# Patient Record
Sex: Female | Born: 1980 | Race: White | Hispanic: No | Marital: Married | Smoking: Never smoker
Health system: Southern US, Community
[De-identification: ages and names within clinical notes are randomized; demographics above are authoritative.]

## PROBLEM LIST (undated history)

## (undated) DIAGNOSIS — E119 Type 2 diabetes mellitus without complications: Secondary | ICD-10-CM

## (undated) DIAGNOSIS — Z789 Other specified health status: Secondary | ICD-10-CM

## (undated) HISTORY — PX: NO PAST SURGERIES: SHX2092

## (undated) HISTORY — DX: Other specified health status: Z78.9

---

## 2005-07-31 ENCOUNTER — Ambulatory Visit: Payer: Self-pay

## 2005-10-29 ENCOUNTER — Inpatient Hospital Stay: Payer: Self-pay | Admitting: Unknown Physician Specialty

## 2005-12-20 ENCOUNTER — Other Ambulatory Visit: Payer: Self-pay

## 2005-12-21 ENCOUNTER — Inpatient Hospital Stay: Payer: Self-pay | Admitting: Internal Medicine

## 2006-01-23 ENCOUNTER — Ambulatory Visit: Payer: Self-pay | Admitting: Specialist

## 2006-04-27 ENCOUNTER — Ambulatory Visit: Payer: Self-pay | Admitting: Specialist

## 2007-04-17 IMAGING — CR DG CHEST 2V
1 series · 3 of 3 positions shown · non-contrast
Comparison: none

REASON FOR EXAM: PE, had VQ scan
COMMENTS:

[Series 4295: postero_anterior · 0.22mm/px · 3 of 3 slices shown]
[im 1/3]
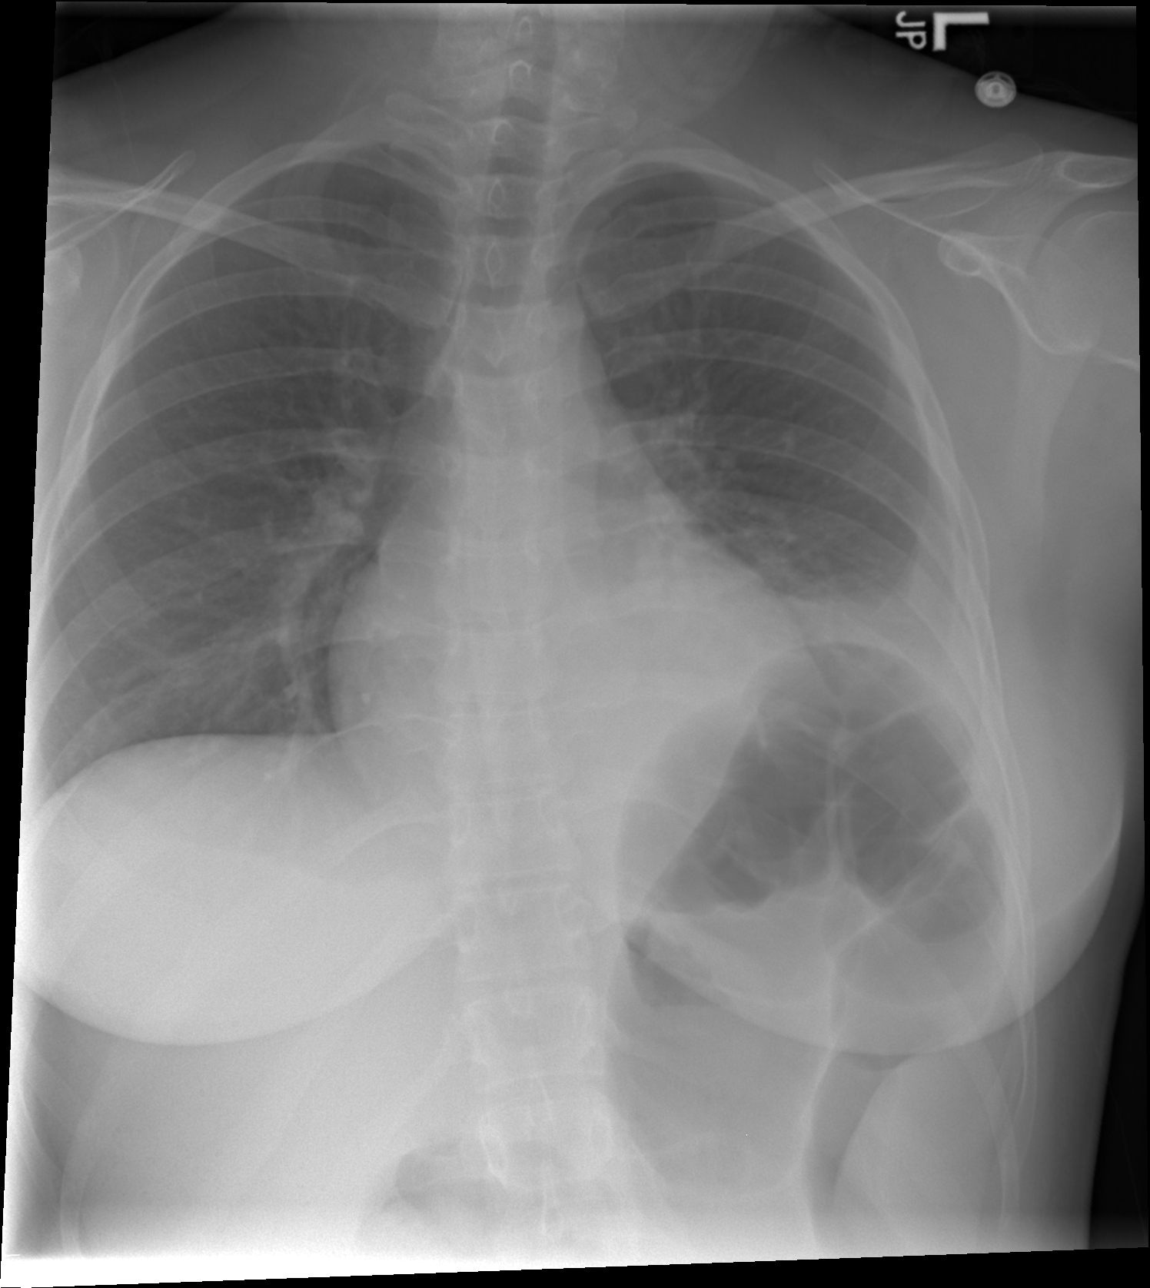
[im 2/3]
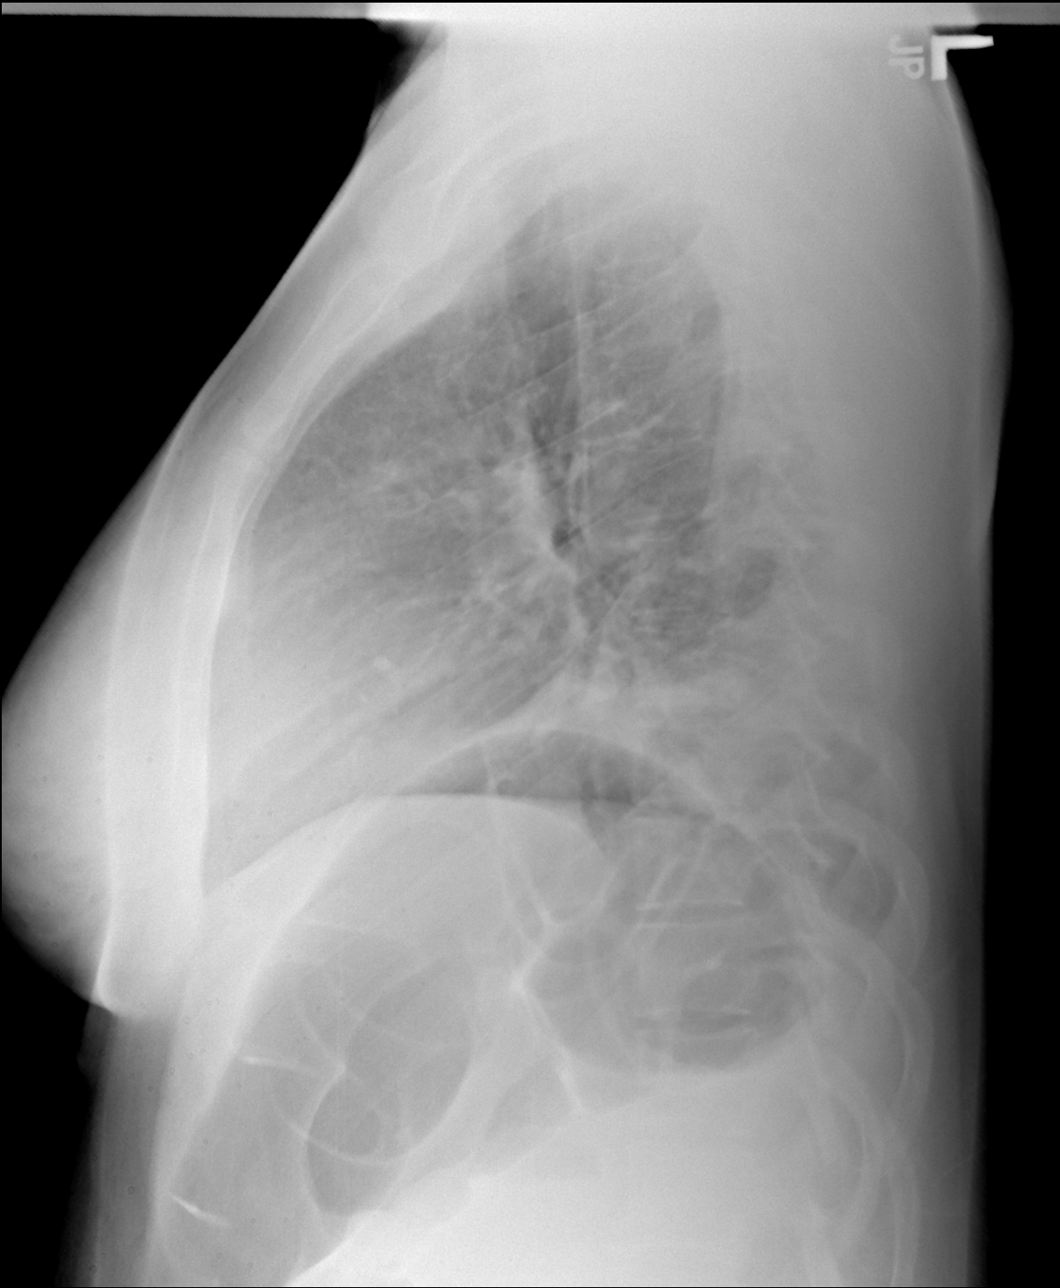
[im 3/3]
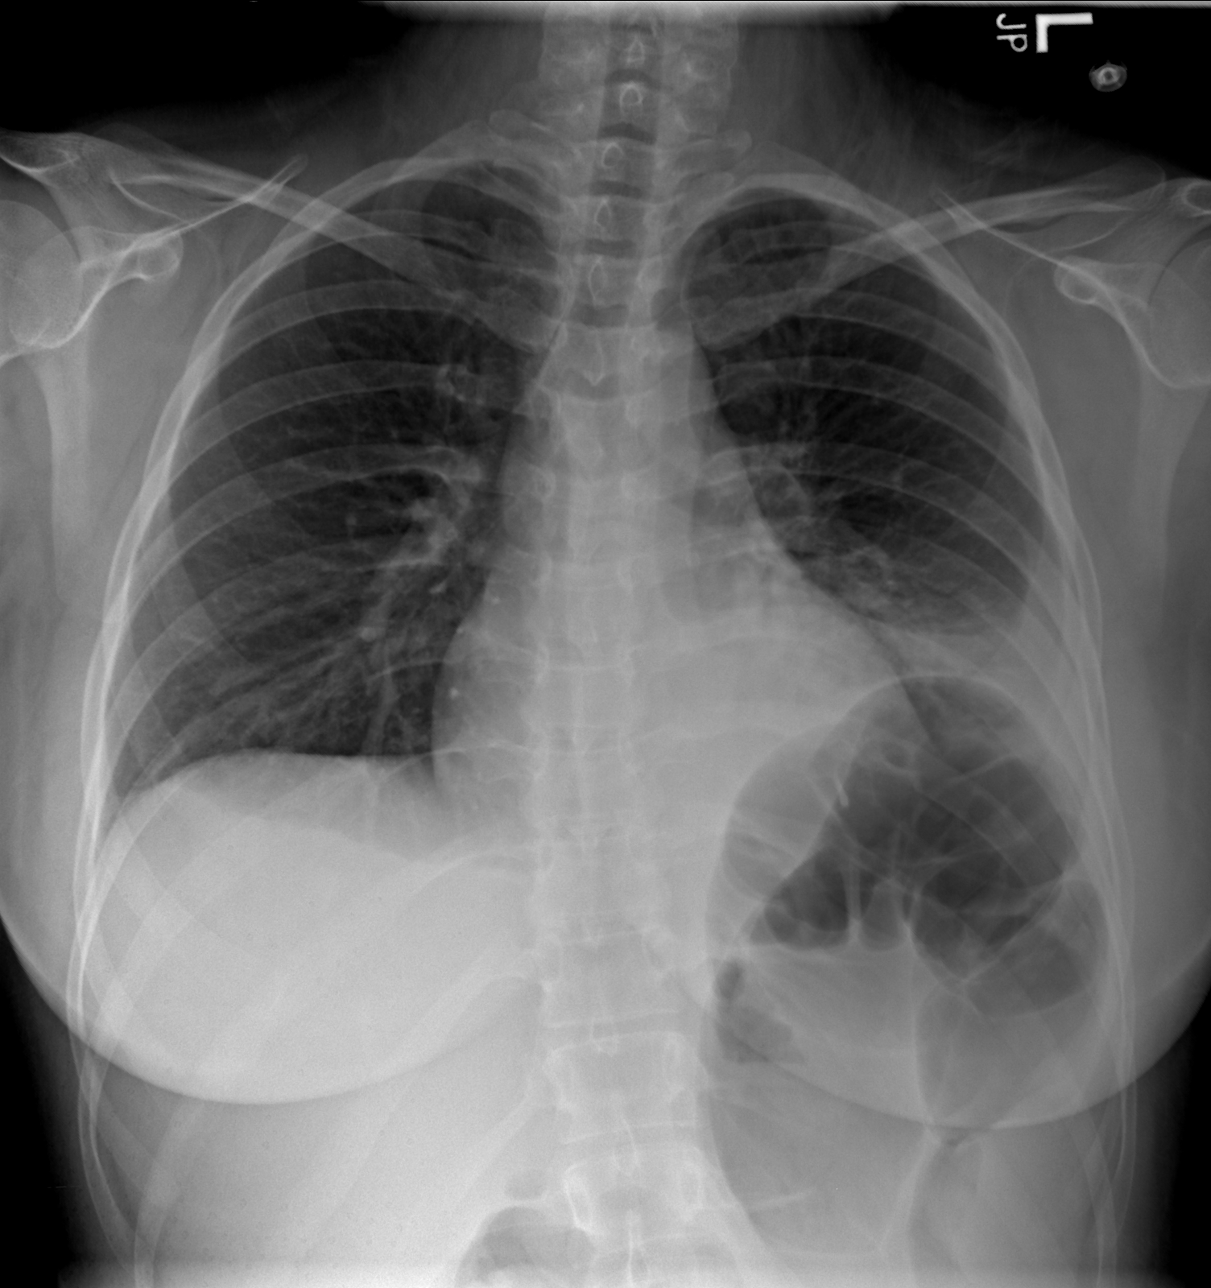

[3 of 3 positions shown; findings below may reference images not displayed]

PROCEDURE:     DXR - DXR CHEST PA (OR AP) AND LATERAL  - December 22, 2005 [DATE]

RESULT:          Comparison is made to a study [DATE].

There has developed a pleural effusion with further atelectasis at the LEFT
lung base posteriorly.  The LEFT hemidiaphragm is slightly elevated with
gaseous distention of a bowel loop deep to this.  The RIGHT lung is clear.
The heart is top normal in size.
IMPRESSION: There is LEFT basilar atelectasis and/or developing
infiltrate.  There is likely Kinley Samir Jose amount of pleural fluid present as
well.

## 2007-08-21 IMAGING — CT CT CHEST W/ CM
1 series · 15 of 31 positions shown, 19 images · non-contrast
Comparison: none

REASON FOR EXAM: Mediastinal adenopathy
COMMENTS:

[Series 2: soft tissue · axial · 0.65mm/px · z∈[-582,-332]mm · 15 of 56 slices shown, 19 images]
[im 3/56  mediastinal]
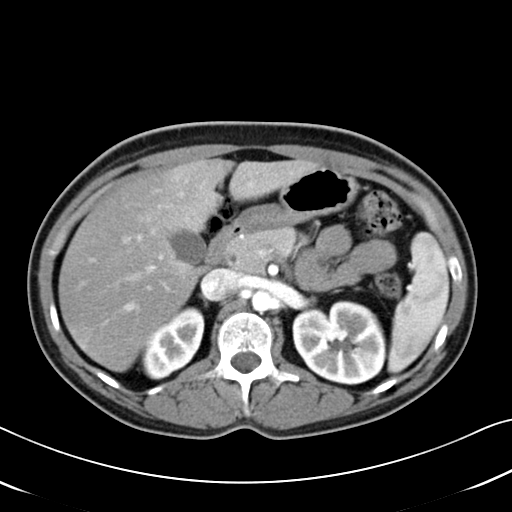
[im 3/56  lung]
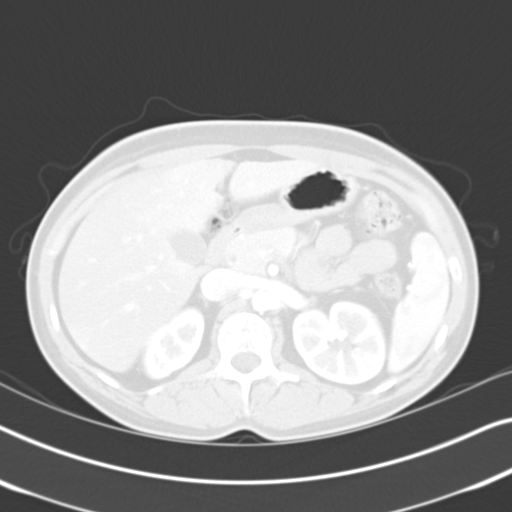
[im 7/56  lung]
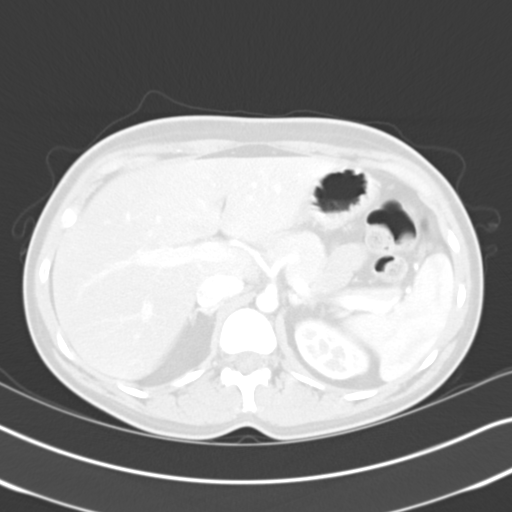
[im 11/56  lung]
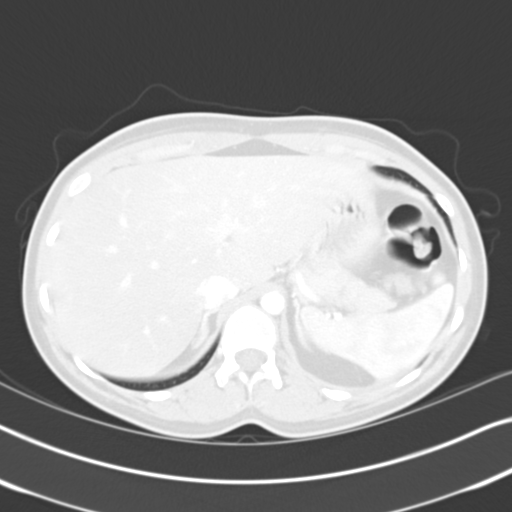
[im 13/56  lung]
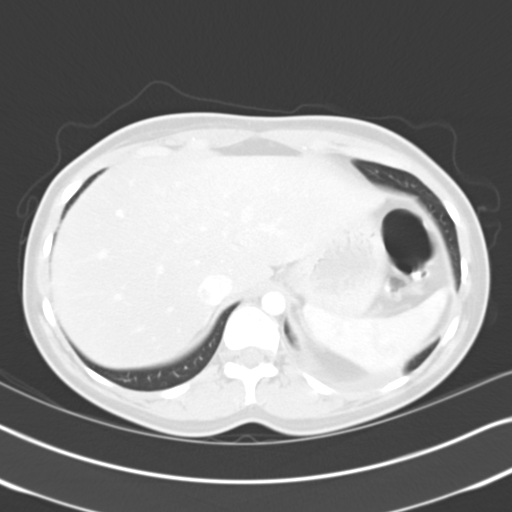
[im 17/56  mediastinal]
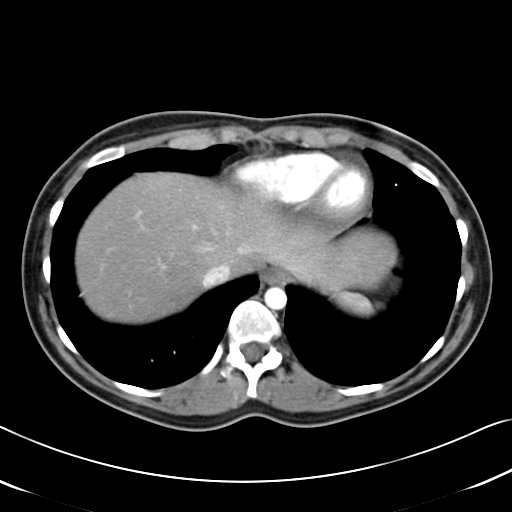
[im 17/56  lung]
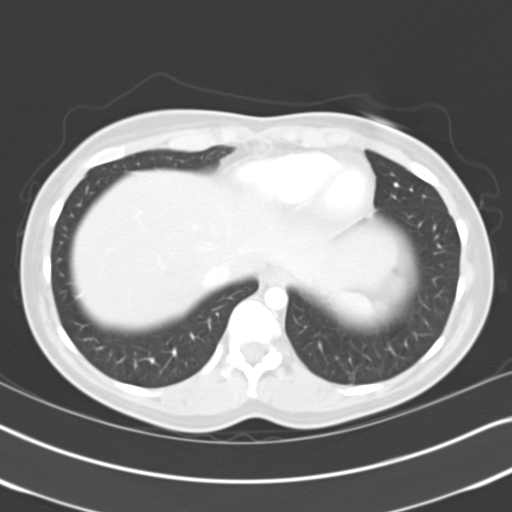
[im 21/56  lung]
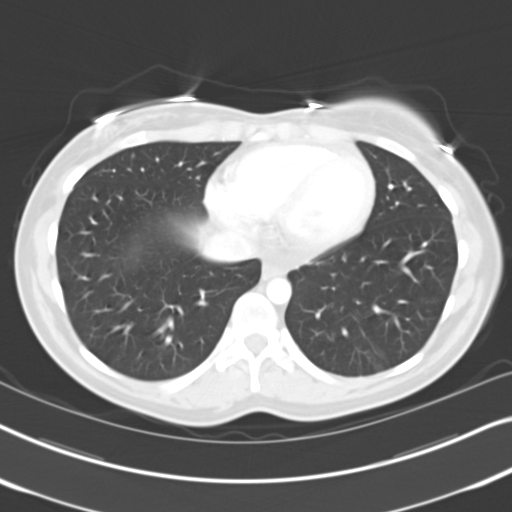
[im 25/56  lung]
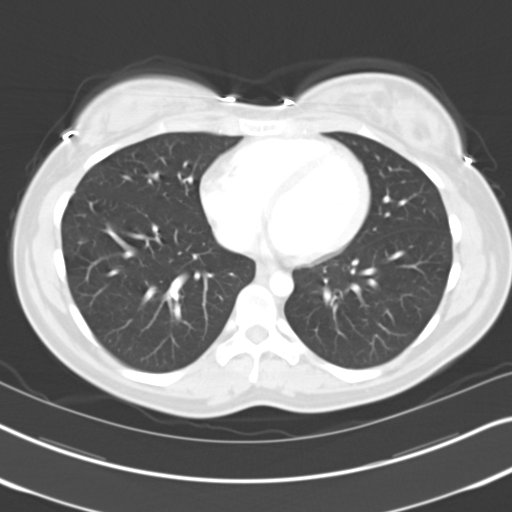
[im 29/56  lung]
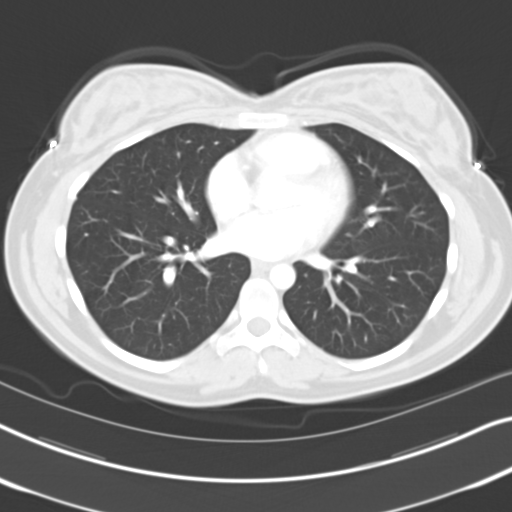
[im 31/56  mediastinal]
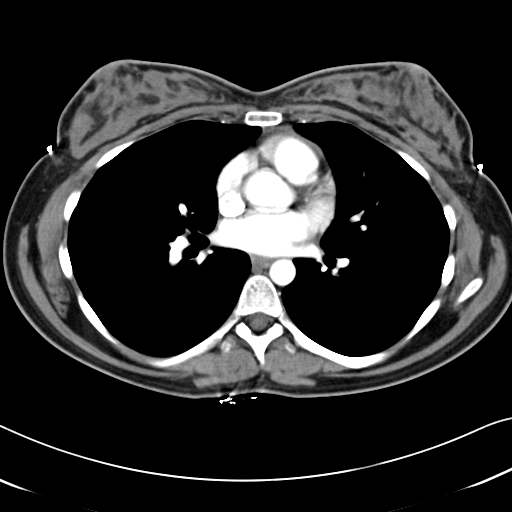
[im 31/56  lung]
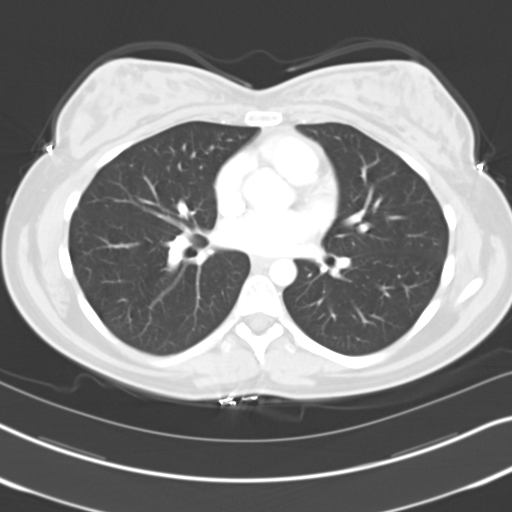
[im 34/56  lung]
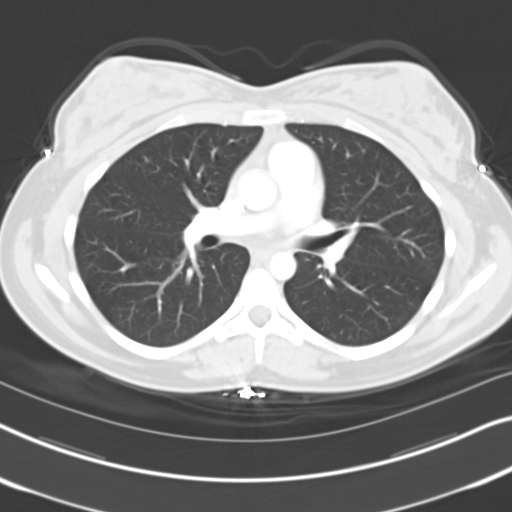
[im 37/56  lung]
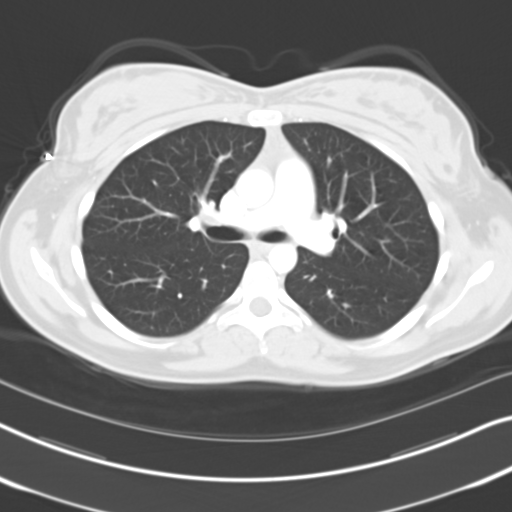
[im 41/56  lung]
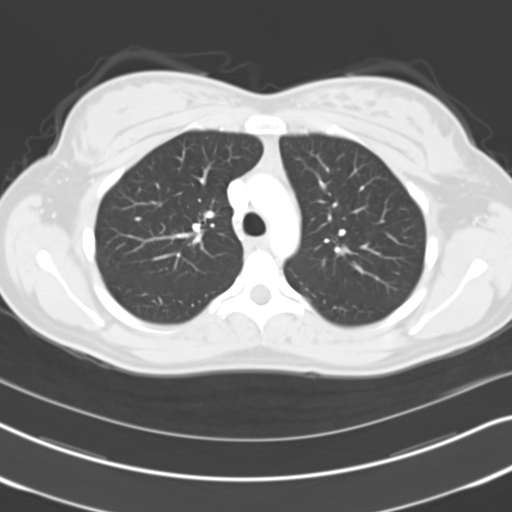
[im 45/56  mediastinal]
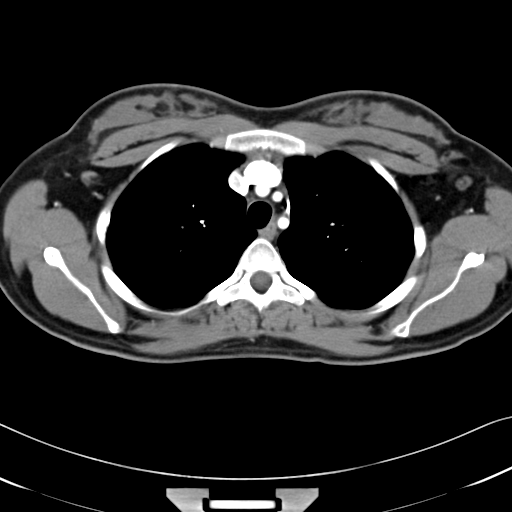
[im 45/56  lung]
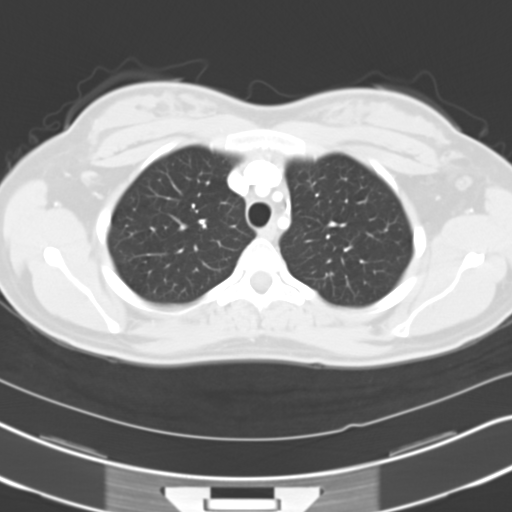
[im 49/56  lung]
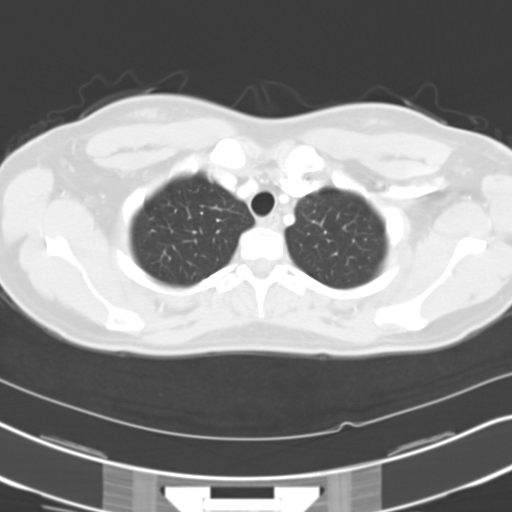
[im 53/56  lung]
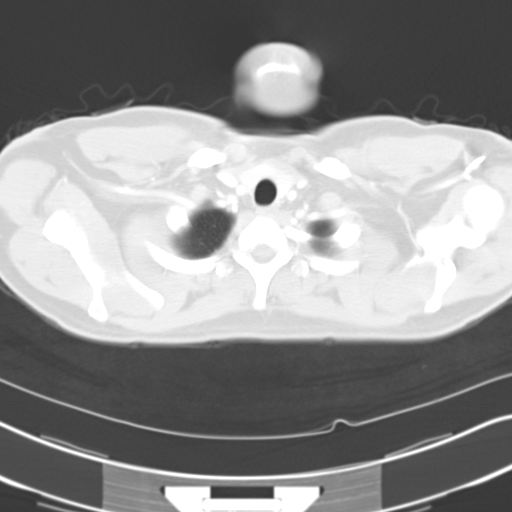

[15 of 31 positions shown; findings below may reference images not displayed]

PROCEDURE:     CT  - CT CHEST WITH CONTRAST  - April 27, 2006 [DATE]

RESULT:     Comparison is made to the prior study dated 12/20/05.

Spiral 5-mm sections were obtained from the thoracic inlet through the lung
bases status post intravenous administration of 75 milliliters of
[id number].

Evaluation of the mediastinum and hilar regions and structures demonstrates
no evidence of mediastinal or hilar adenopathy or masses.  The previously
described LEFT hilar mass appears to have resolved in the interim.
Infiltrate described within the LEFT lung base has also decreased in
conspicuity and size compared to the previous study.  Small LEFT pleural
effusion appears to have resolved.  There does not appear to be evidence of
pulmonary nodules or masses.  Minimal area of increased density is
appreciated within the RIGHT lung base which appears to have resolved in the
interim.  Evaluation of the upper abdominal viscera once again demonstrates
a heterogeneous pattern within the liver.  No further gross abnormalities
are identified.
IMPRESSION: Interval resolution of the previously described LEFT hilar mass and
adenopathy.

No evidence of new pulmonary masses or nodules.

Residual mild increased density is demonstrated within the lung bases.

Note not mentioned above, there is enlargement of axillary nodes which
demonstrates fatty hilum though measure approximately 7-8 mm in diameter.
No further masses or adenopathy is appreciated.

Heterogeneous enhancement pattern of the liver.  This may represent element
of hepatic steatosis.

## 2007-11-30 ENCOUNTER — Emergency Department: Payer: Self-pay | Admitting: Internal Medicine

## 2007-11-30 ENCOUNTER — Other Ambulatory Visit: Payer: Self-pay

## 2017-10-29 ENCOUNTER — Ambulatory Visit (INDEPENDENT_AMBULATORY_CARE_PROVIDER_SITE_OTHER): Payer: BLUE CROSS/BLUE SHIELD | Admitting: Family Medicine

## 2017-10-29 ENCOUNTER — Encounter: Payer: Self-pay | Admitting: Family Medicine

## 2017-10-29 VITALS — BP 140/100 | HR 88 | Temp 97.5°F | Resp 15 | Wt 122.6 lb

## 2017-10-29 DIAGNOSIS — J069 Acute upper respiratory infection, unspecified: Secondary | ICD-10-CM | POA: Diagnosis not present

## 2017-10-29 NOTE — Progress Notes (Signed)
Subjective:     Patient ID: Allison Browning, female   DOB: Oct 25, 1980, 37 y.o.   MRN: [id number] Chief Complaint  Patient presents with  . Sinus Problem    Patient comes in office with concerns of sinus pain and pressure for the past 2 days. Patient reports sinus pressure above eyes, headache, stuffy nose, sinus drainage and cough. Patient has tried otc Cold& Flu   HPI Last took decongestants early this AM  Review of Systems     Objective:   Physical Exam  Constitutional: She appears well-developed and well-nourished. No distress.  Ears: T.M's intact without inflammation Sinuses: mild maxillary sinus tenderness Throat: no tonsillar enlargement or exudate Neck: no cervical adenopathy Lungs: clear     Assessment:    1. Viral upper respiratory tract infection    Plan:    Discussed use of Mucinex D for congestion, Delsym for cough, and Benadryl for postnasal drainage

## 2017-10-29 NOTE — Patient Instructions (Signed)
Discussed use of Mucinex D for congestion, Delsym for cough, and Benadryl for postnasal drainage 

## 2019-10-10 ENCOUNTER — Other Ambulatory Visit: Payer: Self-pay

## 2019-10-11 ENCOUNTER — Ambulatory Visit: Payer: BC Managed Care – PPO | Attending: Internal Medicine

## 2019-10-11 DIAGNOSIS — Z20822 Contact with and (suspected) exposure to covid-19: Secondary | ICD-10-CM

## 2019-10-12 LAB — NOVEL CORONAVIRUS, NAA: SARS-CoV-2, NAA: NOT DETECTED

## 2019-10-18 ENCOUNTER — Ambulatory Visit: Payer: BC Managed Care – PPO | Attending: Internal Medicine

## 2019-10-18 DIAGNOSIS — Z20822 Contact with and (suspected) exposure to covid-19: Secondary | ICD-10-CM

## 2019-10-20 LAB — NOVEL CORONAVIRUS, NAA: SARS-CoV-2, NAA: NOT DETECTED

## 2019-10-26 ENCOUNTER — Ambulatory Visit: Payer: BC Managed Care – PPO | Attending: Internal Medicine

## 2019-10-26 DIAGNOSIS — Z20822 Contact with and (suspected) exposure to covid-19: Secondary | ICD-10-CM

## 2019-10-27 LAB — NOVEL CORONAVIRUS, NAA: SARS-CoV-2, NAA: NOT DETECTED

## 2019-11-01 ENCOUNTER — Ambulatory Visit: Payer: BC Managed Care – PPO | Attending: Internal Medicine

## 2019-11-01 DIAGNOSIS — Z20822 Contact with and (suspected) exposure to covid-19: Secondary | ICD-10-CM

## 2019-11-02 LAB — NOVEL CORONAVIRUS, NAA: SARS-CoV-2, NAA: NOT DETECTED

## 2019-11-07 ENCOUNTER — Ambulatory Visit: Payer: BC Managed Care – PPO | Attending: Internal Medicine

## 2019-11-07 ENCOUNTER — Other Ambulatory Visit: Payer: BC Managed Care – PPO

## 2019-11-07 DIAGNOSIS — Z20822 Contact with and (suspected) exposure to covid-19: Secondary | ICD-10-CM

## 2019-11-08 LAB — NOVEL CORONAVIRUS, NAA: SARS-CoV-2, NAA: DETECTED — AB

## 2019-11-08 NOTE — Progress Notes (Signed)
Your test for COVID-19 was positive ("detected"), meaning that you were infected with the novel coronavirus and could give the germ to others.    Please continue isolation at home, for at least 10 days since the start of your fever/cough/breathlessness and until you have had 24 hours without fever (without taking a fever reducer) and with any cough/breathlessness improving. Use over-the-counter medications for symptoms.  If you have had no symptoms, but were exposed to someone who was positive for COVID-19, you will need to quarantine and self-isolate for 14 days from the date of exposure.    Please continue good preventive care measures, including:  frequent hand-washing, avoid touching your face, cover coughs/sneezes, stay out of crowds and keep a 6 foot distance from others.  Clean hard surfaces touched frequently with disinfectant cleaning products.   Please check in with your primary care provider about your positive test result.  Go to the nearest urgent care or ED for assessment if you have severe breathlessness or severe weakness/fatigue (ex needing new help getting out of bed or to the bathroom).  Members of your household will also need to quarantine for 14 days from the date of your positive test.You may also be contacted by the health department for follow up. Please call [place] at [phone] if you have any questions or concerns.     

## 2020-01-08 ENCOUNTER — Ambulatory Visit: Payer: BC Managed Care – PPO | Attending: Internal Medicine

## 2020-01-08 DIAGNOSIS — Z23 Encounter for immunization: Secondary | ICD-10-CM

## 2020-01-08 NOTE — Progress Notes (Signed)
   Covid-19 Vaccination Clinic  Name:  SHANTANA CHRISTON    MRN: [id number] DOB: [birth year]  01/08/2020  Ms. Lambing was observed post Covid-19 immunization for 15 minutes without incident. She was provided with Vaccine Information Sheet and instruction to access the V-Safe system.   Ms. Stifter was instructed to call 911 with any severe reactions post vaccine: Marland Kitchen Difficulty breathing  . Swelling of face and throat  . A fast heartbeat  . A bad rash all over body  . Dizziness and weakness   Immunizations Administered    Name Date Dose VIS Date Route   Pfizer COVID-19 Vaccine 01/08/2020  4:35 PM 0.3 mL 09/23/2019 Intramuscular   Manufacturer: [employer], [employer]   Lot: [id number]   NDC: [address]-6

## 2020-01-29 ENCOUNTER — Ambulatory Visit: Payer: BC Managed Care – PPO | Attending: Internal Medicine

## 2020-01-29 DIAGNOSIS — Z23 Encounter for immunization: Secondary | ICD-10-CM

## 2020-01-29 NOTE — Progress Notes (Signed)
   Covid-19 Vaccination Clinic  Name:  Allison Browning    MRN: [id number] DOB: 02/05/1981  01/29/2020  [name] was observed post Covid-19 immunization for 15 minutes without incident. She was provided with Vaccine Information Sheet and instruction to access the V-Safe system.   [name] was instructed to call 911 with any severe reactions post vaccine: Marland Kitchen Difficulty breathing  . Swelling of face and throat  . A fast heartbeat  . A bad rash all over body  . Dizziness and weakness   Immunizations Administered    Name Date Dose VIS Date Route   Pfizer COVID-19 Vaccine 01/29/2020  4:46 PM 0.3 mL 09/23/2019 Intramuscular   Manufacturer: [employer], [employer]   Lot: [id number]   NDC: [address]-8

## 2023-05-18 ENCOUNTER — Encounter: Payer: Self-pay | Admitting: Emergency Medicine

## 2023-05-18 ENCOUNTER — Emergency Department
Admission: EM | Admit: 2023-05-18 | Discharge: 2023-05-18 | Disposition: A | Discharge status: Home / Self Care | Payer: BC Managed Care – PPO | Attending: Student in an Organized Health Care Education/Training Program | Admitting: Student in an Organized Health Care Education/Training Program

## 2023-05-18 ENCOUNTER — Other Ambulatory Visit: Payer: Self-pay

## 2023-05-18 ENCOUNTER — Emergency Department: Payer: BC Managed Care – PPO

## 2023-05-18 DIAGNOSIS — R1031 Right lower quadrant pain: Secondary | ICD-10-CM | POA: Insufficient documentation

## 2023-05-18 DIAGNOSIS — R109 Unspecified abdominal pain: Secondary | ICD-10-CM

## 2023-05-18 DIAGNOSIS — R739 Hyperglycemia, unspecified: Secondary | ICD-10-CM | POA: Diagnosis not present

## 2023-05-18 LAB — HEPATIC FUNCTION PANEL
ALT: 32 U/L (ref 0–44)
AST: 35 U/L (ref 15–41)
Albumin: 3.7 g/dL (ref 3.5–5.0)
Alkaline Phosphatase: 101 U/L (ref 38–126)
Bilirubin, Direct: <0.1 mg/dL (ref 0.0–0.2)
Total Bilirubin: 0.6 mg/dL (ref 0.3–1.2)
Total Protein: 7.6 g/dL (ref 6.5–8.1)

## 2023-05-18 LAB — BASIC METABOLIC PANEL
Anion gap: 13 (ref 5–15)
BUN: 18 mg/dL (ref 6–20)
CO2: 22 mmol/L (ref 22–32)
Calcium: 9.1 mg/dL (ref 8.9–10.3)
Chloride: 100 mmol/L (ref 98–111)
Creatinine, Ser: 0.54 mg/dL (ref 0.44–1.00)
GFR, Estimated: >60 mL/min (ref >60)
Glucose, Bld: 331 mg/dL — ABNORMAL HIGH (ref 70–99)
Potassium: 3.4 mmol/L — ABNORMAL LOW (ref 3.5–5.1)
Sodium: 135 mmol/L (ref 135–145)

## 2023-05-18 LAB — URINALYSIS, ROUTINE W REFLEX MICROSCOPIC
Bacteria, UA: NONE SEEN
Bilirubin Urine: NEGATIVE
Glucose, UA: >=500 mg/dL — AB
Ketones, ur: 5 mg/dL — AB
Leukocytes,Ua: NEGATIVE
Nitrite: NEGATIVE
Protein, ur: 100 mg/dL — AB
Specific Gravity, Urine: 1.03 (ref 1.005–1.030)
pH: 7 (ref 5.0–8.0)

## 2023-05-18 LAB — CBC
HCT: 40.3 % (ref 36.0–46.0)
Hemoglobin: 14.3 g/dL (ref 12.0–15.0)
MCH: 30.2 pg (ref 26.0–34.0)
MCHC: 35.5 g/dL (ref 30.0–36.0)
MCV: 85 fL (ref 80.0–100.0)
Platelets: 519 10*3/uL — ABNORMAL HIGH (ref 150–400)
RBC: 4.74 MIL/uL (ref 3.87–5.11)
RDW: 11.4 % — ABNORMAL LOW (ref 11.5–15.5)
WBC: 10 10*3/uL (ref 4.0–10.5)
nRBC: 0 % (ref 0.0–0.2)

## 2023-05-18 LAB — POC URINE PREG, ED: Preg Test, Ur: NEGATIVE

## 2023-05-18 LAB — LIPASE, BLOOD: Lipase: 35 U/L (ref 11–51)

## 2023-05-18 MED ORDER — ONDANSETRON HCL 4 MG/2ML IJ SOLN
4.0000 mg | Freq: Once | INTRAMUSCULAR | Status: AC
  Administered 2023-05-18: 4 mg via INTRAVENOUS
  Filled 2023-05-18: qty 2

## 2023-05-18 MED ORDER — SODIUM CHLORIDE 0.9 % IV BOLUS
1000.0000 mL | Freq: Once | INTRAVENOUS | Status: AC
  Administered 2023-05-18: 1000 mL via INTRAVENOUS

## 2023-05-18 MED ORDER — METFORMIN HCL 500 MG PO TABS: 500.0000 mg | ORAL_TABLET | Freq: Two times a day (BID) | ORAL | 0 refills | Status: DC

## 2023-05-18 MED ORDER — KETOROLAC TROMETHAMINE 15 MG/ML IJ SOLN
15.0000 mg | Freq: Once | INTRAMUSCULAR | Status: AC
  Administered 2023-05-18: 15 mg via INTRAVENOUS
  Filled 2023-05-18: qty 1

## 2023-05-18 MED ORDER — MORPHINE SULFATE (PF) 4 MG/ML IV SOLN
4.0000 mg | INTRAVENOUS | Status: DC | PRN
  Administered 2023-05-18: 4 mg via INTRAVENOUS
  Filled 2023-05-18: qty 1

## 2023-05-18 NOTE — ED Provider Notes (Signed)
[facility] Provider Note    Event Date/Time   First MD Initiated Contact with Patient 05/18/23 1033     (approximate)   History   Flank Pain   HPI  Allison Browning is a 42 y.o. female presents to the ER for evaluation of sudden onset of right flank pain radiating to the right groin.  Denies any fevers has had some nausea vomiting related to this.  No known history of kidney stones.  No dysuria.     Physical Exam   Triage Vital Signs: ED Triage Vitals  Encounter Vitals Group     BP 05/18/23 1024 (!) 154/96     Systolic BP Percentile --      Diastolic BP Percentile --      Pulse Rate 05/18/23 1024 60     Resp 05/18/23 1024 18     Temp 05/18/23 1024 97.8 F (36.6 C)     Temp Source 05/18/23 1024 Oral     SpO2 05/18/23 1024 100 %     Weight 05/18/23 1023 118 lb (53.5 kg)     Height 05/18/23 1023 4\' 10"  (1.473 m)     Head Circumference --      Peak Flow --      Pain Score 05/18/23 1023 10     Pain Loc --      Pain Education --      Exclude from Growth Chart --     Most recent vital signs: Vitals:   05/18/23 1024  BP: (!) 154/96  Pulse: 60  Resp: 18  Temp: 97.8 F (36.6 C)  SpO2: 100%     Constitutional: Alert  Eyes: Conjunctivae are normal.  Head: Atraumatic. Nose: No congestion/rhinnorhea. Mouth/Throat: Mucous membranes are moist.   Neck: Painless ROM.  Cardiovascular:   Good peripheral circulation. Respiratory: Normal respiratory effort.  No retractions.  Gastrointestinal: Soft and nontender in all 4 quadrants.  No guarding or rebound.  There is right CVA discomfort. Musculoskeletal:  no deformity Neurologic:  MAE spontaneously. No gross focal neurologic deficits are appreciated.  Skin:  Skin is warm, dry and intact. No rash noted. Psychiatric: Mood and affect are normal. Speech and behavior are normal.    ED Results / Procedures / Treatments   Labs (all labs ordered are listed, but only abnormal results are  displayed) Labs Reviewed  URINALYSIS, ROUTINE W REFLEX MICROSCOPIC - Abnormal; Notable for the following components:      Result Value   Color, Urine YELLOW (*)    APPearance HAZY (*)    Glucose, UA >=500 (*)    Hgb urine dipstick LARGE (*)    Ketones, ur 5 (*)    Protein, ur 100 (*)    All other components within normal limits  BASIC METABOLIC PANEL - Abnormal; Notable for the following components:   Potassium 3.4 (*)    Glucose, Bld 331 (*)    All other components within normal limits  CBC - Abnormal; Notable for the following components:   RDW 11.4 (*)    Platelets 519 (*)    All other components within normal limits  HEPATIC FUNCTION PANEL  LIPASE, BLOOD  POC URINE PREG, ED     EKG     RADIOLOGY Please see ED Course for my review and interpretation.  I personally reviewed all radiographic images ordered to evaluate for the above acute complaints and reviewed radiology reports and findings.  These findings were personally discussed with the patient.  Please  see medical record for radiology report.    PROCEDURES:  Critical Care performed: No  Procedures   MEDICATIONS ORDERED IN ED: Medications  morphine (PF) 4 MG/ML injection 4 mg (4 mg Intravenous Given 05/18/23 1057)  ondansetron (ZOFRAN) injection 4 mg (4 mg Intravenous Given 05/18/23 1057)  ketorolac (TORADOL) 15 MG/ML injection 15 mg (15 mg Intravenous Given 05/18/23 1107)  sodium chloride 0.9 % bolus 1,000 mL (1,000 mLs Intravenous New Bag/Given 05/18/23 1257)  sodium chloride 0.9 % bolus 1,000 mL (1,000 mLs Intravenous New Bag/Given 05/18/23 1257)     IMPRESSION / MDM / ASSESSMENT AND PLAN / ED COURSE  I reviewed the triage vital signs and the nursing notes.                              Differential diagnosis includes, but is not limited to, stone, biliary pathology, colitis, appendicitis, dehydration, electrolyte abnormality, MSK strain  Patient presenting to the ER for evaluation of symptoms as described  above.  Based on symptoms, risk factors and considered above differential, this presenting complaint could reflect a potentially life-threatening illness therefore the patient will be placed on continuous pulse oximetry and telemetry for monitoring.  Laboratory evaluation will be sent to evaluate for the above complaints.  Clinical exam high suspicion for kidney stone.  Will give IV morphine symptomatic management fluids.   Clinical Course as of 05/18/23 1355  Mon May 18, 2023  1100 Patient is not pregnant.  Will give IV Toradol. [PR]  1225 Patient reassessed. [PR]  1239 Patient noted to be significantly hyperglycemic.  Not consistent with DKA.  Will give IV fluids. [PR]    Clinical Course User Index [PR] Willy Eddy, MD  CT imaging we discussed does not show acute explanation of the right flank pain but does have left Takach renal stone suspect recently passed stone and renal colic as her pain is now resolved.  Patient tolerated fluids she is tolerating p.o.  Dispo stable and appropriate for outpatient follow-up.   FINAL CLINICAL IMPRESSION(S) / ED DIAGNOSES   Final diagnoses:  Right flank pain  Hyperglycemia     Rx / DC Orders   ED Discharge Orders          Ordered    metFORMIN (GLUCOPHAGE) 500 MG tablet  2 times daily with meals,   Status:  Discontinued        05/18/23 1353    metFORMIN (GLUCOPHAGE) 500 MG tablet  2 times daily with meals        05/18/23 1353             Note:  This document was prepared using Dragon voice recognition software and may include unintentional dictation errors.    Willy Eddy, MD 05/18/23 1355

## 2023-05-18 NOTE — ED Triage Notes (Signed)
Patient to ED via POV for right sided flank pain. Started approx 45 minutes PTA. States 3 episodes of vomiting. Denies urinary difficulties.

## 2023-05-21 DIAGNOSIS — E1165 Type 2 diabetes mellitus with hyperglycemia: Secondary | ICD-10-CM | POA: Diagnosis present

## 2023-05-28 DIAGNOSIS — E1159 Type 2 diabetes mellitus with other circulatory complications: Secondary | ICD-10-CM | POA: Diagnosis present

## 2024-06-26 ENCOUNTER — Observation Stay
Admission: EM | Admit: 2024-06-26 | Discharge: 2024-06-27 | Disposition: A | Discharge status: Home / Self Care | Attending: Obstetrics and Gynecology | Admitting: Obstetrics and Gynecology

## 2024-06-26 ENCOUNTER — Emergency Department

## 2024-06-26 ENCOUNTER — Other Ambulatory Visit: Payer: Self-pay

## 2024-06-26 DIAGNOSIS — E1165 Type 2 diabetes mellitus with hyperglycemia: Secondary | ICD-10-CM | POA: Diagnosis not present

## 2024-06-26 DIAGNOSIS — Z7984 Long term (current) use of oral hypoglycemic drugs: Secondary | ICD-10-CM | POA: Insufficient documentation

## 2024-06-26 DIAGNOSIS — I10 Essential (primary) hypertension: Secondary | ICD-10-CM | POA: Insufficient documentation

## 2024-06-26 DIAGNOSIS — I7410 Embolism and thrombosis of unspecified parts of aorta: Secondary | ICD-10-CM | POA: Diagnosis not present

## 2024-06-26 DIAGNOSIS — R109 Unspecified abdominal pain: Secondary | ICD-10-CM | POA: Diagnosis present

## 2024-06-26 DIAGNOSIS — N280 Ischemia and infarction of kidney: Principal | ICD-10-CM

## 2024-06-26 DIAGNOSIS — Z79899 Other long term (current) drug therapy: Secondary | ICD-10-CM | POA: Insufficient documentation

## 2024-06-26 DIAGNOSIS — F1090 Alcohol use, unspecified, uncomplicated: Secondary | ICD-10-CM | POA: Diagnosis not present

## 2024-06-26 DIAGNOSIS — D75839 Thrombocytosis, unspecified: Secondary | ICD-10-CM | POA: Diagnosis not present

## 2024-06-26 DIAGNOSIS — Z7901 Long term (current) use of anticoagulants: Secondary | ICD-10-CM | POA: Diagnosis not present

## 2024-06-26 DIAGNOSIS — E785 Hyperlipidemia, unspecified: Secondary | ICD-10-CM | POA: Insufficient documentation

## 2024-06-26 DIAGNOSIS — E1159 Type 2 diabetes mellitus with other circulatory complications: Secondary | ICD-10-CM | POA: Diagnosis present

## 2024-06-26 HISTORY — DX: Type 2 diabetes mellitus without complications: E11.9

## 2024-06-26 LAB — URINALYSIS, ROUTINE W REFLEX MICROSCOPIC
Bacteria, UA: NONE SEEN
Bilirubin Urine: NEGATIVE
Glucose, UA: >=500 mg/dL — AB
Hgb urine dipstick: NEGATIVE
Ketones, ur: NEGATIVE mg/dL
Leukocytes,Ua: NEGATIVE
Nitrite: NEGATIVE
Protein, ur: NEGATIVE mg/dL
Specific Gravity, Urine: 1.029 (ref 1.005–1.030)
pH: 5 (ref 5.0–8.0)

## 2024-06-26 LAB — BASIC METABOLIC PANEL WITH GFR
Anion gap: 10 (ref 5–15)
BUN: 14 mg/dL (ref 6–20)
CO2: 23 mmol/L (ref 22–32)
Calcium: 9.4 mg/dL (ref 8.9–10.3)
Chloride: 98 mmol/L (ref 98–111)
Creatinine, Ser: 0.62 mg/dL (ref 0.44–1.00)
GFR, Estimated: >60 mL/min (ref >60)
Glucose, Bld: 502 mg/dL (ref 70–99)
Potassium: 4.3 mmol/L (ref 3.5–5.1)
Sodium: 131 mmol/L — ABNORMAL LOW (ref 135–145)

## 2024-06-26 LAB — CBC
HCT: 37.5 % (ref 36.0–46.0)
Hemoglobin: 12.5 g/dL (ref 12.0–15.0)
MCH: 27.8 pg (ref 26.0–34.0)
MCHC: 33.3 g/dL (ref 30.0–36.0)
MCV: 83.5 fL (ref 80.0–100.0)
Platelets: 428 K/uL — ABNORMAL HIGH (ref 150–400)
RBC: 4.49 MIL/uL (ref 3.87–5.11)
RDW: 12.5 % (ref 11.5–15.5)
WBC: 10.8 K/uL — ABNORMAL HIGH (ref 4.0–10.5)
nRBC: 0 % (ref 0.0–0.2)

## 2024-06-26 LAB — PROTIME-INR
INR: 0.9 (ref 0.8–1.2)
Prothrombin Time: 12.2 s (ref 11.4–15.2)

## 2024-06-26 LAB — LIPASE, BLOOD: Lipase: 41 U/L (ref 11–51)

## 2024-06-26 LAB — HEPATIC FUNCTION PANEL
ALT: 23 U/L (ref 0–44)
AST: 32 U/L (ref 15–41)
Albumin: 3.5 g/dL (ref 3.5–5.0)
Alkaline Phosphatase: 99 U/L (ref 38–126)
Bilirubin, Direct: <0.1 mg/dL (ref 0.0–0.2)
Total Bilirubin: 0.5 mg/dL (ref 0.0–1.2)
Total Protein: 7.7 g/dL (ref 6.5–8.1)

## 2024-06-26 LAB — POC URINE PREG, ED: Preg Test, Ur: NEGATIVE

## 2024-06-26 LAB — HEMOGLOBIN A1C
Hgb A1c MFr Bld: 9.1 % — ABNORMAL HIGH (ref 4.8–5.6)
Mean Plasma Glucose: 214.47 mg/dL

## 2024-06-26 LAB — APTT: aPTT: 23 s — ABNORMAL LOW (ref 24–36)

## 2024-06-26 LAB — CBG MONITORING, ED: Glucose-Capillary: 224 mg/dL — ABNORMAL HIGH (ref 70–99)

## 2024-06-26 LAB — TSH: TSH: 1.139 u[IU]/mL (ref 0.350–4.500)

## 2024-06-26 LAB — GLUCOSE, CAPILLARY: Glucose-Capillary: 322 mg/dL — ABNORMAL HIGH (ref 70–99)

## 2024-06-26 LAB — HCG, QUANTITATIVE, PREGNANCY: hCG, Beta Chain, Quant, S: <1 m[IU]/mL (ref <5)

## 2024-06-26 MED ORDER — IOHEXOL 300 MG/ML  SOLN
100.0000 mL | Freq: Once | INTRAMUSCULAR | Status: AC | PRN
Start: 2024-06-26 — End: 2024-06-26
  Administered 2024-06-26: 100 mL via INTRAVENOUS

## 2024-06-26 MED ORDER — LOSARTAN POTASSIUM 50 MG PO TABS
50.0000 mg | ORAL_TABLET | Freq: Every day | ORAL | Status: DC
  Administered 2024-06-26 – 2024-06-27 (×2): 50 mg via ORAL
  Filled 2024-06-26 (×2): qty 1

## 2024-06-26 MED ORDER — OXYCODONE HCL 5 MG PO TABS
5.0000 mg | ORAL_TABLET | ORAL | Status: DC | PRN
  Administered 2024-06-26 – 2024-06-27 (×2): 5 mg via ORAL
  Filled 2024-06-26 (×2): qty 1

## 2024-06-26 MED ORDER — ONDANSETRON HCL 4 MG/2ML IJ SOLN
4.0000 mg | Freq: Once | INTRAMUSCULAR | Status: AC
  Administered 2024-06-26: 4 mg via INTRAVENOUS
  Filled 2024-06-26: qty 2

## 2024-06-26 MED ORDER — LACTATED RINGERS IV BOLUS
1000.0000 mL | Freq: Once | INTRAVENOUS | Status: AC
  Administered 2024-06-26: 1000 mL via INTRAVENOUS

## 2024-06-26 MED ORDER — HYDROMORPHONE HCL 1 MG/ML IJ SOLN
0.5000 mg | Freq: Once | INTRAMUSCULAR | Status: AC
  Administered 2024-06-26: 0.5 mg via INTRAVENOUS
  Filled 2024-06-26: qty 0.5

## 2024-06-26 MED ORDER — HEPARIN BOLUS VIA INFUSION
3600.0000 [IU] | Freq: Once | INTRAVENOUS | Status: AC
  Administered 2024-06-26: 3600 [IU] via INTRAVENOUS
  Filled 2024-06-26: qty 3600

## 2024-06-26 MED ORDER — INSULIN ASPART 100 UNIT/ML IJ SOLN
0.0000 [IU] | Freq: Every day | INTRAMUSCULAR | Status: DC
  Administered 2024-06-26: 4 [IU] via SUBCUTANEOUS
  Filled 2024-06-26: qty 1

## 2024-06-26 MED ORDER — OXYCODONE HCL 5 MG PO TABS: 2.5000 mg | ORAL_TABLET | ORAL | Status: DC | PRN

## 2024-06-26 MED ORDER — HEPARIN (PORCINE) 25000 UT/250ML-% IV SOLN
1200.0000 [IU]/h | INTRAVENOUS | Status: DC
  Administered 2024-06-26: 900 [IU]/h via INTRAVENOUS
  Filled 2024-06-26: qty 250

## 2024-06-26 MED ORDER — OXYCODONE-ACETAMINOPHEN 5-325 MG PO TABS
1.0000 | ORAL_TABLET | ORAL | Status: DC | PRN
  Administered 2024-06-26: 1 via ORAL
  Filled 2024-06-26: qty 1

## 2024-06-26 MED ORDER — INSULIN ASPART 100 UNIT/ML IJ SOLN
0.0000 [IU] | Freq: Three times a day (TID) | INTRAMUSCULAR | Status: DC
  Administered 2024-06-27: 8 [IU] via SUBCUTANEOUS
  Filled 2024-06-26: qty 1

## 2024-06-26 MED ORDER — SODIUM CHLORIDE 0.9% FLUSH
3.0000 mL | Freq: Two times a day (BID) | INTRAVENOUS | Status: DC
  Administered 2024-06-26 – 2024-06-27 (×2): 3 mL via INTRAVENOUS

## 2024-06-26 MED ORDER — ACETAMINOPHEN 325 MG PO TABS
650.0000 mg | ORAL_TABLET | Freq: Four times a day (QID) | ORAL | Status: DC | PRN
  Administered 2024-06-26 – 2024-06-27 (×2): 650 mg via ORAL
  Filled 2024-06-26 (×2): qty 2

## 2024-06-26 MED ORDER — HYDROCHLOROTHIAZIDE 12.5 MG PO TABS
12.5000 mg | ORAL_TABLET | Freq: Every day | ORAL | Status: DC
  Administered 2024-06-27: 12.5 mg via ORAL
  Filled 2024-06-26: qty 1

## 2024-06-26 MED ORDER — PROCHLORPERAZINE EDISYLATE 10 MG/2ML IJ SOLN: 5.0000 mg | Freq: Four times a day (QID) | INTRAMUSCULAR | Status: DC | PRN

## 2024-06-26 NOTE — ED Notes (Signed)
 Pt BP elevated but just took her BP meds for the day.

## 2024-06-26 NOTE — H&P (Addendum)
 History and Physical    Patient: Allison Browning DOB: September 10, 1981 DOA: 06/26/2024 DOS: the patient was seen and examined on 06/26/2024 PCP: [name]  Patient coming from: Home  Chief Complaint:  Chief Complaint  Patient presents with   Flank Pain   HPI: Allison Browning is a 43 y.o. female with medical history significant of diabetes mellitus type II, HTN, HLD, nephrolithiasis, prior PE 18 years ago (suspected to be provoked post C-section and being on OCP's) presented with right-sided flank pain radiating to right lower quadrant since 2-3 days, gradually worsening.  Reports associated nausea, denies vomiting. Denies fever, rash, arthralgias, neuropathy, chills, SOB, dysuria Denies smoking, OCP use, recent procedures.  Reports family history of clots in half brother, does not know further details.  Noncompliant with medication.  Lives with husband at home, independent with ADLs  On presentation to ED afebrile, RR 16, tachycardic 104 improved to 82, BP 197/105 which improved to 168/109, saturating 99% on room air Workup revealed CMP with corrected sodium 137, blood glucose 502, Bicarb 23, anion gap 10.  CBC with mild leukocytosis 10.8, platelet count 428.  UA with glucosuria, negative for UTI.  CT abdomen pelvis shows bilateral renal infarcts, right greater than left, suspicious for embolic infarcts given clot within the suprarenal abdominal aorta.  Pyelonephritis not excluded.  Clot within suprarenal abdominal aorta likely the source of embolic infarcts within the kidney.  Fatty liver Received 2 L LR bolus, IV Zofran , IV Dilaudid , ER physician discussed with vascular surgery, recommend starting IV heparin  drip, no intervention inpatient, recommend outpatient follow up   Review of Systems: As mentioned in the history of present illness. All other systems reviewed and are negative.  Past Medical History:  Diagnosis Date   Diabetes mellitus without complication (HCC)     No known health problems    Past Surgical History:  Procedure Laterality Date   NO PAST SURGERIES     Social History:  reports that she has never smoked. She has never used smokeless tobacco. She reports current alcohol use. She reports that she does not use drugs.  No Known Allergies  Family History  Problem Relation Age of Onset   Healthy Mother    Cancer Father    Hypertension Father    Healthy Sister    Healthy Brother    Healthy Daughter    Heart attack Maternal Grandmother    Heart attack Maternal Grandfather    Heart attack Paternal Grandmother    Heart attack Paternal Grandfather    Healthy Sister     Prior to Admission medications   Medication Sig Start Date End Date Taking? Authorizing Provider  metFORMIN  (GLUCOPHAGE ) 500 MG tablet Take 1 tablet (500 mg total) by mouth 2 (two) times daily with a meal. 05/18/23 06/17/23  Lang Dover, MD    Physical Exam: Vitals:   06/26/24 1342 06/26/24 1343 06/26/24 1630  BP: (!) 197/105  (!) 168/109  Pulse: (!) 104  77  Resp: 16  16  Temp: 98.2 F (36.8 C)    SpO2: 99%  90%  Weight:  56.2 kg   Height:  4' 10 (1.473 m)    Physical Exam  General: No acute distress Lungs: clear to auscultation bilaterally Cardio: RRR, no murmurs heard ABD: Soft, nontender, nondistended, no CVA tenderness bilaterally Neuro: AO x 3, no gross focal deficits  Data Reviewed: Labs and Imaging reviewed  Assessment and Plan:  Bilateral renal infarcts Suprarenal aortic thrombus - Hx prior PE 18  years ago (suspected to be provoked post C-section and being on OCP's) presented with right-sided flank pain radiating to right lower quadrant - Denies fever/chills, rash, arthralgias, neuropathy, smoking, OCP use, recent procedures.  Reports family history of clots in half brother, does not know further details - CT abdomen pelvis shows bilateral renal infarcts, right greater than left, suspicious for embolic infarcts given clot within the  suprarenal abdominal aorta - ER physician discussed with vascular surgery, recommend starting IV heparin  drip, no intervention inpatient, recommend outpatient follow up - On IV heparin  infusion - Pain management, antiemetics as needed - TSH, hypercoagulable workup, ANCA - Echo, telemetry monitoring for arrhythmias  Mild thrombocytosis - Monitor PC  DM type II - HbA1c pending - Was on metformin , noncompliant due to GI issues - Blood glucose on presentation 502, repeat 224, no evidence of DKA - SSI, monitor and titrate as needed - Diabetic co-ordinator consult  HTN - On presentation 197/105 - Reports being on medication in the past, noncompliant - Start losartan  50 mg-hydrochlorothiazide  12.5  HLD - Check lipid panel - Noncompliant with statin  Incidental finding Fatty liver   Advance Care Planning:   Code Status: Full Code (discussed with patient at the bedside)  Consults: None  Family Communication: Discussed with patient and husband at the bedside  Severity of Illness: The appropriate patient status for this patient is INPATIENT. Inpatient status is judged to be reasonable and necessary in order to provide the required intensity of service to ensure the patient's safety. The patient's presenting symptoms, physical exam findings, and initial radiographic and laboratory data in the context of their chronic comorbidities is felt to place them at high risk for further clinical deterioration. Furthermore, it is not anticipated that the patient will be medically stable for discharge from the hospital within 2 midnights of admission.   * I certify that at the point of admission it is my clinical judgment that the patient will require inpatient hospital care spanning beyond 2 midnights from the point of admission due to high intensity of service, high risk for further deterioration and high frequency of surveillance required.*  Author: Laree Lock, MD 06/26/2024 6:19 PM  For  on call review [email or web address].

## 2024-06-26 NOTE — ED Triage Notes (Signed)
 Pt to ED for right flank pain radiating to right lower abd x2 days. +nausea. Denies urinary sx. Hx kidney stones.

## 2024-06-26 NOTE — ED Provider Notes (Signed)
 [facility] ([facility]) Provider Note    Event Date/Time   First MD Initiated Contact with Patient 06/26/24 1406     (approximate)   History   Flank Pain   HPI  Allison Browning is a 43 y.o. female with history of diabetes who comes in with right sided abdominal pain.  Does report a little bit of pain in her flank.  Does report history of kidney stones.  She reports that she stopped taking her metformin  about a week ago because it caused a lot of GI upset.  She reports a little bit of nausea but no vomiting.  She reports being on medications for her blood pressure as well as for her anxiety.  She denies any other symptoms associated with the back pain, abdominal pain.  Denies any liver issues or history of drinking.   Physical Exam   Triage Vital Signs: ED Triage Vitals  Encounter Vitals Group     BP 06/26/24 1342 (!) 197/105     Girls Systolic BP Percentile --      Girls Diastolic BP Percentile --      Boys Systolic BP Percentile --      Boys Diastolic BP Percentile --      Pulse Rate 06/26/24 1342 (!) 104     Resp 06/26/24 1342 16     Temp 06/26/24 1342 98.2 F (36.8 C)     Temp src --      SpO2 06/26/24 1342 99 %     Weight 06/26/24 1343 124 lb (56.2 kg)     Height 06/26/24 1343 4' 10 (1.473 m)     Head Circumference --      Peak Flow --      Pain Score 06/26/24 1343 9     Pain Loc --      Pain Education --      Exclude from Growth Chart --     Most recent vital signs: Vitals:   06/26/24 1342  BP: (!) 197/105  Pulse: (!) 104  Resp: 16  Temp: 98.2 F (36.8 C)  SpO2: 99%     General: Awake, no distress.  CV:  Good peripheral perfusion.  Resp:  Normal effort.  Abd:  Positive distention with tenderness in the right lower abdomen. Other:     ED Results / Procedures / Treatments   Labs (all labs ordered are listed, but only abnormal results are displayed) Labs Reviewed  CBC - Abnormal; Notable for the following components:      Result  Value   WBC 10.8 (*)    Platelets 428 (*)    All other components within normal limits  URINALYSIS, ROUTINE W REFLEX MICROSCOPIC  BASIC METABOLIC PANEL WITH GFR  POC URINE PREG, ED     EKG  My interpretation of EKG:  Normal sinus rate 71 without any ST elevation or T wave inversion  RADIOLOGY I have reviewed the xray personally and interpreted no evidence of kidney stone   PROCEDURES:  Critical Care performed: Yes, see critical care procedure note(s)  .Critical Care  Performed by: Ernest Ronal BRAVO, MD Authorized by: Ernest Ronal BRAVO, MD   Critical care provider statement:    Critical care time (minutes):  30   Critical care was necessary to treat or prevent imminent or life-threatening deterioration of the following conditions: embolic clots.   Critical care was time spent personally by me on the following activities:  Development of treatment plan with patient or surrogate, discussions  with consultants, evaluation of patient's response to treatment, examination of patient, ordering and review of laboratory studies, ordering and review of radiographic studies, ordering and performing treatments and interventions, pulse oximetry, re-evaluation of patient's condition and review of old charts    MEDICATIONS ORDERED IN ED: Medications  oxyCODONE -acetaminophen  (PERCOCET/ROXICET) 5-325 MG per tablet 1 tablet (1 tablet Oral Given 06/26/24 1348)  heparin  bolus via infusion 3,600 Units (has no administration in time range)  heparin  ADULT infusion 100 units/mL (25000 units/250mL) (has no administration in time range)  lactated ringers  bolus 1,000 mL (1,000 mLs Intravenous New Bag/Given 06/26/24 1437)  lactated ringers  bolus 1,000 mL (1,000 mLs Intravenous New Bag/Given 06/26/24 1438)  HYDROmorphone  (DILAUDID ) injection 0.5 mg (0.5 mg Intravenous Given 06/26/24 1452)  ondansetron  (ZOFRAN ) injection 4 mg (4 mg Intravenous Given 06/26/24 1452)  iohexol  (OMNIPAQUE ) 300 MG/ML solution 100 mL (100  mLs Intravenous Contrast Given 06/26/24 1528)     IMPRESSION / MDM / ASSESSMENT AND PLAN / ED COURSE  I reviewed the triage vital signs and the nursing notes.   Patient's presentation is most consistent with acute presentation with potential threat to life or bodily function.   Differential includes pregnancy, appendicitis, kidney stone, liver failure.  Will get CT scan with contrast to further evaluate. Patient treated with Dilaudid  Prior to test negative.  Urine without evidence of UTI BMP shows elevated glucose she reports being noncompliant with medications but no evidence of DKA.  4:59 PM concern for super renal aortic thrombus with concern for positive renal infarcts  Discussed with vascular Chen-no emergent vascular procedure needed inpatient but can be followed up outpatient with vascular  Will start patient on heparin  and admit to the hospitalist for workup for embolic phenomenon.  Will get EKG, cardiac monitoring and patient will require an echo inpatient.   The patient is on the cardiac monitor to evaluate for evidence of arrhythmia and/or significant heart rate changes.      FINAL CLINICAL IMPRESSION(S) / ED DIAGNOSES   Final diagnoses:  Renal infarct (HCC)  Thrombus of aorta (HCC)     Rx / DC Orders   ED Discharge Orders     None        Note:  This document was prepared using Dragon voice recognition software and may include unintentional dictation errors.   Ernest Ronal BRAVO, MD 06/26/24 [phone]

## 2024-06-26 NOTE — ED Notes (Signed)
 BG 502 called by lab informed EDP Funke face to face.

## 2024-06-26 NOTE — Consult Note (Signed)
 Pharmacy Consult Note - Anticoagulation  Pharmacy Consult for heparin  Indication: VTE  PATIENT MEASUREMENTS: Height: 4' 10 (147.3 cm) Weight: 56.2 kg (124 lb) IBW/kg (Calculated) : 40.9 HEPARIN  DW (KG): 52.7  VITAL SIGNS: Temp: 98.2 F (36.8 C) (09/14 1342) BP: 168/109 (09/14 1630) Pulse Rate: 77 (09/14 1630)  Recent Labs    06/26/24 1343  HGB 12.5  HCT 37.5  PLT 428*  CREATININE 0.62    Estimated Creatinine Clearance: 69.4 mL/min (by C-G formula based on SCr of 0.62 mg/dL).  PAST MEDICAL HISTORY: Past Medical History:  Diagnosis Date   Diabetes mellitus without complication (HCC)    No known health problems     ASSESSMENT: 43 y.o. female with PMH including kidney stones, diabetes is presenting with 2-day history of right flank and lower abdominal pain with nausea. No other urinary symptoms. CTAP shows bilateral renal infarcts suspicious for embolic etiology along with clot within suprarenal abdominal aorta. Patient is not on chronic anticoagulation per chart review. Pharmacy has been consulted to initiate and manage heparin  intravenous infusion.  Pertinent medications: No chronic anticoagulation PTA per chart review  Goal(s) of therapy: Heparin  level 0.3 - 0.7 units/mL Monitor platelets by anticoagulation protocol: Yes   Baseline anticoagulation labs: Recent Labs    06/26/24 1343  HGB 12.5  PLT 428*   Baseline aPTT and INR have been ordered.  Date Time aPTT/HL Rate/Comment    PLAN: Give 3600 units bolus x1; then start heparin  infusion at 900 units/hour. Check heparin  level in 6 hours, then daily once at least two levels are consecutively therapeutic. Monitor CBC daily while on heparin  infusion.   Will M. Lenon, PharmD, BCPS Clinical Pharmacist 06/26/2024 5:58 PM

## 2024-06-27 ENCOUNTER — Inpatient Hospital Stay (HOSPITAL_BASED_OUTPATIENT_CLINIC_OR_DEPARTMENT_OTHER): Admit: 2024-06-27 | Discharge: 2024-06-27 | Disposition: A | Discharge status: Home / Self Care

## 2024-06-27 ENCOUNTER — Telehealth (HOSPITAL_COMMUNITY): Payer: Self-pay | Admitting: Pharmacy Technician

## 2024-06-27 ENCOUNTER — Other Ambulatory Visit (HOSPITAL_COMMUNITY): Payer: Self-pay

## 2024-06-27 ENCOUNTER — Other Ambulatory Visit: Payer: Self-pay

## 2024-06-27 DIAGNOSIS — R008 Other abnormalities of heart beat: Secondary | ICD-10-CM | POA: Diagnosis not present

## 2024-06-27 DIAGNOSIS — N280 Ischemia and infarction of kidney: Secondary | ICD-10-CM | POA: Diagnosis not present

## 2024-06-27 LAB — CBC
HCT: 35.1 % — ABNORMAL LOW (ref 36.0–46.0)
Hemoglobin: 11.5 g/dL — ABNORMAL LOW (ref 12.0–15.0)
MCH: 27.8 pg (ref 26.0–34.0)
MCHC: 32.8 g/dL (ref 30.0–36.0)
MCV: 84.8 fL (ref 80.0–100.0)
Platelets: 447 K/uL — ABNORMAL HIGH (ref 150–400)
RBC: 4.14 MIL/uL (ref 3.87–5.11)
RDW: 12.7 % (ref 11.5–15.5)
WBC: 13.1 K/uL — ABNORMAL HIGH (ref 4.0–10.5)
nRBC: 0 % (ref 0.0–0.2)

## 2024-06-27 LAB — BASIC METABOLIC PANEL WITH GFR
Anion gap: 8 (ref 5–15)
BUN: 12 mg/dL (ref 6–20)
CO2: 27 mmol/L (ref 22–32)
Calcium: 8.7 mg/dL — ABNORMAL LOW (ref 8.9–10.3)
Chloride: 100 mmol/L (ref 98–111)
Creatinine, Ser: 0.61 mg/dL (ref 0.44–1.00)
GFR, Estimated: >60 mL/min (ref >60)
Glucose, Bld: 215 mg/dL — ABNORMAL HIGH (ref 70–99)
Potassium: 3.9 mmol/L (ref 3.5–5.1)
Sodium: 135 mmol/L (ref 135–145)

## 2024-06-27 LAB — LIPID PANEL
Cholesterol: 228 mg/dL — ABNORMAL HIGH (ref 0–200)
HDL: 35 mg/dL — ABNORMAL LOW (ref >40)
LDL Cholesterol: UNDETERMINED mg/dL (ref 0–99)
Total CHOL/HDL Ratio: 6.5 ratio
Triglycerides: 760 mg/dL — ABNORMAL HIGH (ref <150)
VLDL: UNDETERMINED mg/dL (ref 0–40)

## 2024-06-27 LAB — ECHOCARDIOGRAM COMPLETE
Area-P 1/2: 3.99 cm2
Height: 58 in
S' Lateral: 2.52 cm
Weight: 1950.63 [oz_av]

## 2024-06-27 LAB — ANTITHROMBIN III: AntiThromb III Func: 109 % (ref 75–120)

## 2024-06-27 LAB — HEPARIN LEVEL (UNFRACTIONATED)
Heparin Unfractionated: 0.15 [IU]/mL — ABNORMAL LOW (ref 0.30–0.70)
Heparin Unfractionated: 0.2 [IU]/mL — ABNORMAL LOW (ref 0.30–0.70)

## 2024-06-27 LAB — LDL CHOLESTEROL, DIRECT: Direct LDL: 144 mg/dL — ABNORMAL HIGH (ref 0–99)

## 2024-06-27 LAB — GLUCOSE, CAPILLARY: Glucose-Capillary: 260 mg/dL — ABNORMAL HIGH (ref 70–99)

## 2024-06-27 MED ORDER — HEPARIN BOLUS VIA INFUSION
1600.0000 [IU] | Freq: Once | INTRAVENOUS | Status: AC
  Administered 2024-06-27: 1600 [IU] via INTRAVENOUS
  Filled 2024-06-27: qty 1600

## 2024-06-27 MED ORDER — APIXABAN 5 MG PO TABS
ORAL_TABLET | ORAL | 1 refills | Status: AC
  Filled 2024-06-27: qty 74, 30d supply, fill #0

## 2024-06-27 MED ORDER — APIXABAN 5 MG PO TABS
10.0000 mg | ORAL_TABLET | Freq: Once | ORAL | Status: DC
Start: 2024-06-27 — End: 2024-06-27

## 2024-06-27 MED ORDER — INSULIN STARTER KIT- PEN NEEDLES (ENGLISH)
1.0000 | Freq: Once | Status: AC
  Administered 2024-06-27: 1
  Filled 2024-06-27: qty 1

## 2024-06-27 MED ORDER — ATORVASTATIN CALCIUM 80 MG PO TABS
80.0000 mg | ORAL_TABLET | Freq: Every day | ORAL | 1 refills | Status: AC
  Filled 2024-06-27: qty 30, 30d supply, fill #0

## 2024-06-27 MED ORDER — APIXABAN 5 MG PO TABS
5.0000 mg | ORAL_TABLET | Freq: Two times a day (BID) | ORAL | Status: DC
Start: 2024-07-04 — End: 2024-06-27

## 2024-06-27 MED ORDER — HEPARIN BOLUS VIA INFUSION
750.0000 [IU] | Freq: Once | INTRAVENOUS | Status: AC
  Administered 2024-06-27: 750 [IU] via INTRAVENOUS
  Filled 2024-06-27: qty 750

## 2024-06-27 MED ORDER — APIXABAN 5 MG PO TABS
10.0000 mg | ORAL_TABLET | Freq: Two times a day (BID) | ORAL | Status: DC
Start: 2024-06-27 — End: 2024-06-27
  Administered 2024-06-27: 10 mg via ORAL
  Filled 2024-06-27: qty 2

## 2024-06-27 MED ORDER — ESCITALOPRAM OXALATE 10 MG PO TABS
20.0000 mg | ORAL_TABLET | Freq: Every day | ORAL | Status: DC
  Administered 2024-06-27: 20 mg via ORAL
  Filled 2024-06-27: qty 2

## 2024-06-27 MED ORDER — LIVING WELL WITH DIABETES BOOK
Freq: Once | Status: AC
  Filled 2024-06-27: qty 1

## 2024-06-27 NOTE — Telephone Encounter (Signed)
 Patient Product/process development scientist completed.    The patient is insured through [facility]. Patient has [employer], may use a copay card, and/or apply for patient assistance if available.    Ran test claim for Eliquis  Starter Pack and the current 30 day co-pay is $160.96.   This test claim was processed through [facility]- copay amounts may vary at other pharmacies due to pharmacy/plan contracts, or as the patient moves through the different stages of their insurance plan.     Reyes Sharps, CPHT Pharmacy Technician III Certified Patient Advocate [facility] Pharm[facility] Patient Advocate Team Direct Number: [phone]  Fax: [phone]

## 2024-06-27 NOTE — Inpatient Diabetes Management (Signed)
 Inpatient Diabetes Program Recommendations  AACE/ADA: New Consensus Statement on Inpatient Glycemic Control (2015)  Target Ranges:  Prepandial:   less than 140 mg/dL      Peak postprandial:   less than 180 mg/dL (1-2 hours)      Critically ill patients:  140 - 180 mg/dL   Lab Results  Component Value Date   GLUCAP 260 (H) 06/27/2024   HGBA1C 9.1 (H) 06/26/2024    Latest Reference Range & Units 06/26/24 18:10 06/26/24 23:32 06/27/24 07:16  Glucose-Capillary 70 - 99 mg/dL 775 (H) 677 (H) 739 (H)  (H): Data is abnormally high  Diabetes history: DM2 Outpatient Diabetes medications:  Metformin  1 gm bid (not taken x 2 weeks due to nausea) Current orders for Inpatient glycemic control: Novolog  0-15 units tid, 0-5 units hs correction  Inpatient Diabetes Program Recommendations:   Please consider: -Lantus 10 units now and daily (0.15 units/kg x 55.3 kg = 8 units)  Spoke with pt about A1C results 9.1 (average blood glucose 214 over the past 2-3 months) and explained what an A1C is, basic pathophysiology of DM Type 2, basic home care, basic diabetes diet nutrition principles, importance of checking CBGs and maintaining good CBG control to prevent long-term and short-term complications. Reviewed signs and symptoms of hyperglycemia and hypoglycemia and how to treat hypoglycemia at home. Also reviewed blood sugar goals at home.  RNs to provide ongoing basic DM education at bedside with this patient. Patient works @ [employer] from 4 am to 1 pm. Patient is willing to start walking and join [employer] to have a place for walking. Patient also has room for improvement in decreasing carbs and sugars in drinks and food. Patient would like to improve nutrition, increase exercise, and maybe try reduced dose of Metformin  instead of starting insulin  if possible. She has appt. with PCP on September 25th to discuss other med options.   Thank you, Allison Browning E. Rigel Filsinger, RN, MSN, CNS, CDCES  Diabetes  Coordinator Inpatient Glycemic Control Team Team Pager [phone] (8am-5pm) 06/27/2024 9:44 AM

## 2024-06-27 NOTE — Plan of Care (Signed)
  Problem: Education: Goal: Ability to describe self-care measures that may prevent or decrease complications (Diabetes Survival Skills Education) will improve Outcome: Progressing   Problem: Coping: Goal: Ability to adjust to condition or change in health will improve Outcome: Progressing

## 2024-06-27 NOTE — Discharge Instructions (Addendum)
 Information on my medicine - ELIQUIS  (apixaban )  This medication education was reviewed with me or my healthcare representative as part of my discharge preparation.  Why was Eliquis  prescribed for you? Eliquis  was prescribed to treat blood clots that may have been found in the veins of your legs (deep vein thrombosis) or in your lungs (pulmonary embolism) and to reduce the risk of them occurring again.  What do You need to know about Eliquis  ? The starting dose is 10 mg (two 5 mg tablets) taken TWICE daily for the FIRST SEVEN (7) DAYS, then on (enter date)  07/04/24  the dose is reduced to ONE 5 mg tablet taken TWICE daily.  Eliquis  may be taken with or without food.   Try to take the dose about the same time in the morning and in the evening. If you have difficulty swallowing the tablet whole please discuss with your pharmacist how to take the medication safely.  Take Eliquis  exactly as prescribed and DO NOT stop taking Eliquis  without talking to the doctor who prescribed the medication.  Stopping may increase your risk of developing a new blood clot.  Refill your prescription before you run out.  After discharge, you should have regular check-up appointments with your healthcare provider that is prescribing your Eliquis .    What do you do if you miss a dose? If a dose of ELIQUIS  is not taken at the scheduled time, take it as soon as possible on the same day and twice-daily administration should be resumed. The dose should not be doubled to make up for a missed dose.  Important Safety Information A possible side effect of Eliquis  is bleeding. You should call your healthcare provider right away if you experience any of the following: Bleeding from an injury or your nose that does not stop. Unusual colored urine (red or dark brown) or unusual colored stools (red or black). Unusual bruising for unknown reasons. A serious fall or if you hit your head (even if there is no  bleeding).  Some medicines may interact with Eliquis  and might increase your risk of bleeding or clotting while on Eliquis . To help avoid this, consult your healthcare provider or pharmacist prior to using any new prescription or non-prescription medications, including herbals, vitamins, non-steroidal anti-inflammatory drugs (NSAIDs) and supplements.  This website has more information on Eliquis  (apixaban ): [email or web address] .[email or web address] dena

## 2024-06-27 NOTE — Consult Note (Signed)
 Pharmacy Consult Note - Anticoagulation  Pharmacy Consult for heparin  Indication: VTE  PATIENT MEASUREMENTS: Height: 4' 10 (147.3 cm) Weight: 55.3 kg (121 lb 14.6 oz) IBW/kg (Calculated) : 40.9 HEPARIN  DW (KG): 52.7  VITAL SIGNS: Temp: 97.9 F (36.6 C) (09/14 2328) Temp Source: Oral (09/14 2328) BP: 135/85 (09/14 2328) Pulse Rate: 84 (09/14 2328)  Recent Labs    06/26/24 1343 06/26/24 1730 06/26/24 2346  HGB 12.5  --   --   HCT 37.5  --   --   PLT 428*  --   --   APTT  --  23*  --   LABPROT  --  12.2  --   INR  --  0.9  --   HEPARINUNFRC  --   --  0.15*  CREATININE 0.62  --   --     Estimated Creatinine Clearance: 68.9 mL/min (by C-G formula based on SCr of 0.62 mg/dL).  PAST MEDICAL HISTORY: Past Medical History:  Diagnosis Date   Diabetes mellitus without complication (HCC)    No known health problems     ASSESSMENT: 43 y.o. female with PMH including kidney stones, diabetes is presenting with 2-day history of right flank and lower abdominal pain with nausea. No other urinary symptoms. CTAP shows bilateral renal infarcts suspicious for embolic etiology along with clot within suprarenal abdominal aorta. Patient is not on chronic anticoagulation per chart review. Pharmacy has been consulted to initiate and manage heparin  intravenous infusion.  Pertinent medications: No chronic anticoagulation PTA per chart review  Goal(s) of therapy: Heparin  level 0.3 - 0.7 units/mL Monitor platelets by anticoagulation protocol: Yes   Baseline anticoagulation labs: Recent Labs    06/26/24 1343 06/26/24 1730  APTT  --  23*  INR  --  0.9  HGB 12.5  --   PLT 428*  --    Baseline aPTT and INR have been ordered.  Date Time aPTT/HL Rate/Comment 9/14     2346      0.15                 900 units/hr    PLAN: 9/14:  HL @ 2346 = 0.15, SUBtherapeutic  - will order heparin  1600 units IV X 1 bolus and increase drip rate to 1100 units/hr - recheck HL 6 hrs after rate change   Monitor CBC daily while on heparin  infusion.   Sya Nestler D Clinical Pharmacist 06/27/2024 12:48 AM

## 2024-06-27 NOTE — Consult Note (Addendum)
 Pharmacy Consult Note - Anticoagulation  Pharmacy Consult for heparin  Indication: VTE  PATIENT MEASUREMENTS: Height: 4' 10 (147.3 cm) Weight: 55.3 kg (121 lb 14.6 oz) IBW/kg (Calculated) : 40.9 HEPARIN  DW (KG): 52.7  VITAL SIGNS: Temp: 97.9 F (36.6 C) (09/14 2328) Temp Source: Oral (09/14 2328) BP: 135/85 (09/14 2328) Pulse Rate: 84 (09/14 2328)  Recent Labs    06/26/24 1730 06/26/24 2346 06/27/24 0328 06/27/24 0642  HGB  --   --  11.5*  --   HCT  --   --  35.1*  --   PLT  --   --  447*  --   APTT 23*  --   --   --   LABPROT 12.2  --   --   --   INR 0.9  --   --   --   HEPARINUNFRC  --    < >  --  0.20*  CREATININE  --   --  0.61  --    < > = values in this interval not displayed.    Estimated Creatinine Clearance: 68.9 mL/min (by C-G formula based on SCr of 0.61 mg/dL).  PAST MEDICAL HISTORY: Past Medical History:  Diagnosis Date   Diabetes mellitus without complication (HCC)    No known health problems     ASSESSMENT: 43 y.o. female with PMH including kidney stones, diabetes is presenting with 2-day history of right flank and lower abdominal pain with nausea. No other urinary symptoms. CTAP shows bilateral renal infarcts suspicious for embolic etiology along with clot within suprarenal abdominal aorta. Patient is not on chronic anticoagulation per chart review. Pharmacy has been consulted to initiate and manage heparin  intravenous infusion.  Pertinent medications: No chronic anticoagulation PTA per chart review  Goal(s) of therapy: Heparin  level 0.3 - 0.7 units/mL aPTT 66 - 102 seconds Monitor platelets by anticoagulation protocol: Yes   Baseline anticoagulation labs: Recent Labs    06/26/24 1343 06/26/24 1730 06/27/24 0328  APTT  --  23*  --   INR  --  0.9  --   HGB 12.5  --  11.5*  PLT 428*  --  447*   Date Time aPTT/HL Rate/Comment 9/14     2346    HL 0.15          SUBtherapeutic 9/15 0642 HL 0.20 SUBtherapeutic  PLAN: Give heparin  bolus of  750 units x1 Increase heparin  infusion to 1200 units/hour Due to elevated triglyceride levels, will use aPTT levels to guide infusion rate - will check aPTT level 6 hours after rate change Monitor CBC daily while on heparin  infusion.  Thank you for involving pharmacy in this patient's care.   Damien Napoleon, PharmD Clinical Pharmacist 06/27/2024 7:11 AM

## 2024-06-27 NOTE — Discharge Summary (Signed)
 Allison Browning FMW:[id number] DOB: 05/26/1981 DOA: 06/26/2024  PCP: [facility]  Admit date: 06/26/2024 Discharge date: 06/27/2024  Time spent: 35 minutes  Recommendations for Outpatient Follow-up:  Pcp f/u, close attention to comorbid conditions Hematology f/u 1 week Vascular surgery f/u 1 month     Discharge Diagnoses:  Principal Problem:   Renal infarct Allison Browning) Active Problems:   Type 2 diabetes mellitus with hyperglycemia, without long-term current use of insulin  (HCC)   Hypertension associated with type 2 diabetes mellitus (HCC)   Discharge Condition: stable  Diet recommendation: heart healthy carb modified  Filed Weights   06/26/24 1343 06/26/24 2342  Weight: 56.2 kg 55.3 kg    History of present illness:  From admission h and p Allison Browning is a 43 y.o. female with medical history significant of diabetes mellitus type II, HTN, HLD, nephrolithiasis, prior PE 18 years ago (suspected to be provoked post C-section and being on OCP's) presented with right-sided flank pain radiating to right lower quadrant since 2-3 days, gradually worsening.  Reports associated nausea, denies vomiting. Denies fever, rash, arthralgias, neuropathy, chills, SOB, dysuria Denies smoking, OCP use, recent procedures.  Reports family history of clots in half brother, does not know further details.  Noncompliant with medication.  Lives with husband at home, independent with ADLs   On presentation to ED afebrile, RR 16, tachycardic 104 improved to 82, BP 197/105 which improved to 168/109, saturating 99% on room air Workup revealed CMP with corrected sodium 137, blood glucose 502, Bicarb 23, anion gap 10.  CBC with mild leukocytosis 10.8, platelet count 428.  UA with glucosuria, negative for UTI.  CT abdomen pelvis shows bilateral renal infarcts, right greater than left, suspicious for embolic infarcts given clot within the suprarenal abdominal aorta.  Pyelonephritis not excluded.  Clot within  suprarenal abdominal aorta likely the source of embolic infarcts within the kidney.  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 No a fib on tele/EKG, TTE unremarkable. Hypercoag w/u ordered and patient placed on iv heparin . Discussed w/ heme dr. Jacobo who advises f/u with him in about a week, discharge on anticoagulation. Reviewed with vascular surgery (Allison Browning), no intervention indicated, likely consequence of ruptured atherosclerotic plaque, advises f/u with him in about one month. Needs aggressive focus on risk factor reduction as diabetes is uncontrolled, lipids quite elevated. Will increase atorva to max dose in addition to the apixaban  we are prescribing, given elevated triglycerides consider addition of fibrate if this does not improved with lifestyle changes and max statin. Is only on metformin  for dm so multiple options available (A1c 9.1%), a glp-1 would be a great choice given cardiovascular benefits. Sglt2i also a consideration. Insulin  of course would be effective. Symptoms are much improved and patient is stable for discharge.  Procedures: none   Consultations: See above  Discharge Exam: Vitals:   06/26/24 2328 06/27/24 0716  BP: 135/85 (!) 155/93  Pulse: 84 92  Resp: 18 16  Temp: 97.9 F (36.6 C) 98.6 F (37 C)  SpO2: 98% 98%    General: NAD Cardiovascular: RRR Respiratory: CTAB  Discharge Instructions   Discharge Instructions     Diet -  low sodium heart healthy   Complete by: As directed    Diet Carb Modified   Complete by: As directed    Increase activity slowly   Complete by: As directed        Allergies as of 06/27/2024   No Known Allergies      Medication List     STOP taking these medications    meloxicam 15 MG tablet Commonly known as: MOBIC       TAKE these medications    atorvastatin  80 MG tablet Commonly known as: LIPITOR Take 1 tablet (80 mg total) by mouth daily. What changed:  medication strength how much to take   cyclobenzaprine 5 MG tablet Commonly known as: FLEXERIL Take 5 mg by mouth at bedtime as needed for muscle spasms.   Eliquis  5 MG Tabs tablet Generic drug: apixaban  Take 2 tablets (10 mg total) by mouth 2 (two) times daily for 7 days, THEN 1 tablet (5 mg total) 2 (two) times daily. Start taking on: June 27, 2024   escitalopram  20 MG tablet Commonly known as: LEXAPRO  Take 20 mg by mouth daily.   hydrochlorothiazide  12.5 MG tablet Commonly known as: HYDRODIURIL  Take 12.5 mg by mouth daily.   losartan  100 MG tablet Commonly known as: COZAAR  Take 100 mg by mouth daily.   metFORMIN  500 MG tablet Commonly known as: GLUCOPHAGE  Take 1,000 mg by mouth 2 (two) times daily with a meal.       No Known Allergies  Follow-up Information     Allison Browning Follow up.   Specialties: Vascular Surgery, Radiology, Interventional Cardiology Why: in about one month Contact information: [address] [place] [address] [phone]         Allison Browning Follow up.   Why: 1-2 weeks hospital follow up Contact information: [address] [place] [address] [phone]         Allison Browning Follow up.   Specialty: Oncology Why: in about 1 week, his office will be calling you Contact information: [address] [place] [address] [phone]                  The results of significant diagnostics from this hospitalization (including imaging, microbiology, ancillary and laboratory) are listed below for reference.    Significant Diagnostic  Studies: ECHOCARDIOGRAM COMPLETE Result Date: 06/27/2024    ECHOCARDIOGRAM REPORT   Patient Name:   Allison Browning Date of Exam: 06/27/2024 Medical Rec #:  [id number]      Height:       58.0 in Accession #:    [id number]     Weight:       121.9 lb Date of Birth:  Apr 14, 1981     BSA:          1.476 m Patient Age:    42 years       BP:           155/93 mmHg Patient Gender: F              HR:           87 bpm. Exam Location:  [facility] Procedure: 2D Echo, Cardiac Doppler and Color Doppler (Both Spectral and Color            Flow Doppler were utilized during procedure). Indications:     Other abnormalities of the heart  History:         Patient has no  prior history of Echocardiogram examinations.                  Arrythmias:LBBB.  Sonographer:     Philomena Daring Referring Phys:  [id number] [facility] Browning PONNALA Diagnosing Phys: Deatrice Cage Browning IMPRESSIONS  1. Left ventricular ejection fraction, by estimation, is 65 to 70%. The left ventricle has normal function. The left ventricle has no regional wall motion abnormalities. There is mild left ventricular hypertrophy. Left ventricular diastolic parameters were normal.  2. Right ventricular systolic function is normal. The right ventricular size is normal. Tricuspid regurgitation signal is inadequate for assessing PA pressure.  3. The mitral valve is normal in structure. No evidence of mitral valve regurgitation. No evidence of mitral stenosis.  4. The aortic valve is normal in structure. Aortic valve regurgitation is not visualized. No aortic stenosis is present.  5. The inferior vena cava is normal in size with greater than 50% respiratory variability, suggesting right atrial pressure of 3 mmHg. FINDINGS  Left Ventricle: Left ventricular ejection fraction, by estimation, is 65 to 70%. The left ventricle has normal function. The left ventricle has no regional wall motion abnormalities. The left ventricular internal cavity size was normal in size. There is  mild left ventricular  hypertrophy. Left ventricular diastolic parameters were normal. Right Ventricle: The right ventricular size is normal. No increase in right ventricular wall thickness. Right ventricular systolic function is normal. Tricuspid regurgitation signal is inadequate for assessing PA pressure. Left Atrium: Left atrial size was normal in size. Right Atrium: Right atrial size was normal in size. Pericardium: There is no evidence of pericardial effusion. Mitral Valve: The mitral valve is normal in structure. No evidence of mitral valve regurgitation. No evidence of mitral valve stenosis. Tricuspid Valve: The tricuspid valve is normal in structure. Tricuspid valve regurgitation is not demonstrated. No evidence of tricuspid stenosis. Aortic Valve: The aortic valve is normal in structure. Aortic valve regurgitation is not visualized. No aortic stenosis is present. Pulmonic Valve: The pulmonic valve was normal in structure. Pulmonic valve regurgitation is trivial. No evidence of pulmonic stenosis. Aorta: The aortic root is normal in size and structure. Venous: The inferior vena cava is normal in size with greater than 50% respiratory variability, suggesting right atrial pressure of 3 mmHg. IAS/Shunts: No atrial level shunt detected by color flow Doppler.  LEFT VENTRICLE PLAX 2D LVIDd:         4.05 cm   Diastology LVIDs:         2.52 cm   LV e' medial:    5.90 cm/s LV PW:         1.06 cm   LV E/e' medial:  12.3 LV IVS:        1.24 cm   LV e' lateral:   8.22 cm/s LVOT diam:     2.00 cm   LV E/e' lateral: 8.8 LV SV:         55 LV SV Index:   37 LVOT Area:     3.14 cm  RIGHT VENTRICLE             IVC RV S prime:     11.90 cm/s  IVC diam: 1.74 cm TAPSE (M-mode): 2.0 cm LEFT ATRIUM           Index        RIGHT ATRIUM           Index LA diam:      3.30 cm 2.24 cm/m   RA Area:  10.40 cm LA Vol (A2C): 26.4 ml 17.89 ml/m  RA Volume:   20.90 ml  14.16 ml/m LA Vol (A4C): 37.7 ml 25.55 ml/m  AORTIC VALVE LVOT Vmax:   95.60 cm/s LVOT  Vmean:  70.200 cm/s LVOT VTI:    0.175 m  AORTA Ao Root diam: 2.70 cm Ao Asc diam:  3.00 cm MITRAL VALVE MV Area (PHT): 3.99 cm    SHUNTS MV Decel Time: 190 msec    Systemic VTI:  0.18 m MV E velocity: 72.60 cm/s  Systemic Diam: 2.00 cm MV A velocity: 61.20 cm/s MV E/A ratio:  1.19 Deatrice Cage Browning Electronically signed by Deatrice Cage Browning Signature Date/Time: 06/27/2024/9:04:33 AM    Final    CT ABDOMEN PELVIS W CONTRAST Result Date: 06/26/2024 CLINICAL DATA:  Abdominal pain. EXAM: CT ABDOMEN AND PELVIS WITH CONTRAST TECHNIQUE: Multidetector CT imaging of the abdomen and pelvis was performed using the standard protocol following bolus administration of intravenous contrast. RADIATION DOSE REDUCTION: This exam was performed according to the departmental dose-optimization program which includes automated exposure control, adjustment of the mA and/or kV according to patient size and/or use of iterative reconstruction technique. CONTRAST:  OMNIPAQUE  IOHEXOL  300 MG/ML  SOLN COMPARISON:  CT abdomen pelvis dated 05/18/2023. FINDINGS: Lower chest: Trace left pleural effusion. The visualized lung bases are otherwise clear. No intra-abdominal free air or free fluid. Hepatobiliary: Fatty liver. No biliary dilatation. The gallbladder is unremarkable. Pancreas: Unremarkable. No pancreatic ductal dilatation or surrounding inflammatory changes. Spleen: Normal in size without focal abnormality. Adrenals/Urinary Tract: The adrenal glands unremarkable. There is 30 genius enhancement of the renal parenchyma bilaterally, right greater than left, which may represent pyelonephritis or related to renal infarcts. Correlation with clinical exam and urinalysis recommended. There is mild right renal parenchyma atrophy and cortical irregularity, new since the prior CT suggestive of infarcts and possibly sequela prior pyelonephritis. There is no hydronephrosis on either side. The visualized ureters and urinary bladder appear  unremarkable. Stomach/Bowel: Evaluation of the bowel is limited in the absence of oral contrast. There is no bowel obstruction or active inflammation. The appendix is normal. Vascular/Lymphatic: There is an 11 mm long thrombus within the abdominal aorta adherent to the anterior aortic wall just above the renal arteries. The renal artery findings described above likely represent sequela of embolic infarct. Faint linear intraluminal density in the proximal right renal artery (29/2 and coronal 61/5) may represent nonocclusive emboli or areas of scarring. The origins of the celiac trunk, SMA, and IMA remain patent. The IVC is unremarkable. No portal venous gas. There is no adenopathy. Reproductive: The uterus is grossly unremarkable. No suspicious adnexal masses. Other: None Musculoskeletal: No acute or significant osseous findings. IMPRESSION: 1. Bilateral renal infarcts, right greater than left, suspicious for embolic infarcts given clot within the suprarenal abdominal aorta. Pyelonephritis is not excluded. Correlation with clinical exam and urinalysis recommended. No abscess. 2. Clot within the suprarenal abdominal aorta likely the source of embolic infarcts within the kidneys. Possible tiny emboli versus scarring in the right renal artery. 3. Fatty liver. 4. No bowel obstruction. Normal appendix. These results were called by telephone at the time of interpretation on 06/26/2024 at 4:51 pm to provider [facility] , who verbally acknowledged these results. Electronically Signed   By: Vanetta Chou M.D.   On: 06/26/2024 16:59    Microbiology: No results found for this or any previous visit (from the past 240 hours).   Labs: Basic Metabolic Panel: Recent Labs  Lab 06/26/24  1343 06/27/24 0328  NA 131* 135  K 4.3 3.9  CL 98 100  CO2 23 27  GLUCOSE 502* 215*  BUN 14 12  CREATININE 0.62 0.61  CALCIUM  9.4 8.7*   Liver Function Tests: Recent Labs  Lab 06/26/24 1343  AST 32  ALT 23  ALKPHOS 99   BILITOT 0.5  PROT 7.7  ALBUMIN 3.5   Recent Labs  Lab 06/26/24 1343  LIPASE 41   No results for input(s): AMMONIA in the last 168 hours. CBC: Recent Labs  Lab 06/26/24 1343 06/27/24 0328  WBC 10.8* 13.1*  HGB 12.5 11.5*  HCT 37.5 35.1*  MCV 83.5 84.8  PLT 428* 447*   Cardiac Enzymes: No results for input(s): CKTOTAL, CKMB, CKMBINDEX, TROPONINI in the last 168 hours. BNP: BNP (last 3 results) No results for input(s): BNP in the last 8760 hours.  ProBNP (last 3 results) No results for input(s): PROBNP in the last 8760 hours.  CBG: Recent Labs  Lab 06/26/24 1810 06/26/24 2332 06/27/24 0716  GLUCAP 224* 322* 260*       Signed:  Devaughn KATHEE Ban Browning.  [facility] 06/27/2024, 10:34 AM

## 2024-06-28 LAB — ANCA PROFILE
Anti-MPO Antibodies: <0.2 U (ref 0.0–0.9)
Anti-PR3 Antibodies: <0.2 U (ref 0.0–0.9)
Atypical P-ANCA titer: <1:20 {titer}
C-ANCA: <1:20 {titer}
P-ANCA: <1:20 {titer}

## 2024-06-29 LAB — LUPUS ANTICOAGULANT PANEL
DRVVT: 30.5 s (ref 0.0–47.0)
PTT Lupus Anticoagulant: 30.6 s (ref 0.0–43.5)

## 2024-06-29 LAB — PROTEIN C ACTIVITY: Protein C Activity: 196 % — ABNORMAL HIGH (ref 73–180)

## 2024-06-29 LAB — PROTEIN S, TOTAL: Protein S Ag, Total: 119 % (ref 60–150)

## 2024-06-29 LAB — BETA-2-GLYCOPROTEIN I ABS, IGG/M/A
Beta-2 Glyco I IgG: <9 GPI IgG units (ref 0–20)
Beta-2-Glycoprotein I IgA: <9 GPI IgA units (ref 0–25)
Beta-2-Glycoprotein I IgM: <9 GPI IgM units (ref 0–32)

## 2024-06-29 LAB — PROTEIN S ACTIVITY: Protein S Activity: 59 % — ABNORMAL LOW (ref 63–140)

## 2024-06-29 LAB — HOMOCYSTEINE: Homocysteine: 8.3 umol/L (ref 0.0–14.5)

## 2024-06-30 LAB — CARDIOLIPIN ANTIBODIES, IGG, IGM, IGA
Anticardiolipin IgA: <9 U/mL (ref 0–11)
Anticardiolipin IgG: 11 GPL U/mL (ref 0–14)
Anticardiolipin IgM: <9 [MPL'U]/mL (ref 0–12)

## 2024-06-30 LAB — FACTOR 5 LEIDEN

## 2024-07-01 LAB — PROTHROMBIN GENE MUTATION

## 2024-07-01 LAB — PROTEIN C, TOTAL: Protein C, Total: 162 % — ABNORMAL HIGH (ref 60–150)

## 2024-08-24 ENCOUNTER — Other Ambulatory Visit (INDEPENDENT_AMBULATORY_CARE_PROVIDER_SITE_OTHER): Payer: Self-pay | Admitting: Nurse Practitioner

## 2024-08-24 DIAGNOSIS — N280 Ischemia and infarction of kidney: Secondary | ICD-10-CM

## 2024-08-24 DIAGNOSIS — I7410 Embolism and thrombosis of unspecified parts of aorta: Secondary | ICD-10-CM

## 2024-08-26 ENCOUNTER — Encounter (INDEPENDENT_AMBULATORY_CARE_PROVIDER_SITE_OTHER): Payer: Self-pay | Admitting: Vascular Surgery

## 2024-08-26 ENCOUNTER — Ambulatory Visit (INDEPENDENT_AMBULATORY_CARE_PROVIDER_SITE_OTHER): Payer: Self-pay

## 2024-08-26 ENCOUNTER — Ambulatory Visit (INDEPENDENT_AMBULATORY_CARE_PROVIDER_SITE_OTHER): Payer: Self-pay | Admitting: Vascular Surgery

## 2024-08-26 VITALS — BP 167/96 | HR 74 | Resp 16 | Ht <= 58 in | Wt 121.0 lb

## 2024-08-26 DIAGNOSIS — E1165 Type 2 diabetes mellitus with hyperglycemia: Secondary | ICD-10-CM

## 2024-08-26 DIAGNOSIS — I7410 Embolism and thrombosis of unspecified parts of aorta: Secondary | ICD-10-CM

## 2024-08-26 DIAGNOSIS — E1159 Type 2 diabetes mellitus with other circulatory complications: Secondary | ICD-10-CM | POA: Diagnosis not present

## 2024-08-26 DIAGNOSIS — I152 Hypertension secondary to endocrine disorders: Secondary | ICD-10-CM

## 2024-08-26 DIAGNOSIS — N280 Ischemia and infarction of kidney: Secondary | ICD-10-CM

## 2024-08-26 NOTE — Assessment & Plan Note (Signed)
 We repeated a duplex which showed resolution of her aortic thrombus in the juxtarenal/suprarenal aorta.  The echogenicity of her kidneys was abnormal particular on the right consistent with her renal infarcts but there was no significant renal artery stenosis.  Her infarcts seem to be improving.  Her renal function has remained okay.  Her aortic thrombus is now resolved but I have recommended reinstitution of anticoagulation.

## 2024-08-26 NOTE — Progress Notes (Signed)
 Patient ID: Allison Browning, female   DOB: Jul 17, 1981, 43 y.o.   MRN: [id number]  Chief Complaint  Patient presents with   Follow-up    one month + renal duplex (Supra renal aortic thrombus). Per fb    HPI Allison Browning is a 43 y.o. female.  I am asked to see the patient by Dr. 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  She was in the hospital about 2 months ago and had a CT angiogram which I independently reviewed.  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  She was initially started on anticoagulation and discharged on this.  She says this has become cost prohibitive and she is now only taking an aspirin daily.  She is feeling well.  She is no longer having abdominal pain and having no other complaints today.  We repeated a duplex which showed resolution of her aortic thrombus in the juxtarenal/suprarenal aorta.  The echogenicity of her kidneys was abnormal particular on the right consistent with her renal infarcts but there was no significant renal artery stenosis.     Past Medical History:  Diagnosis Date   Diabetes mellitus without complication (HCC)    No known health problems     Past Surgical History:  Procedure Laterality Date   NO PAST SURGERIES       Family History  Problem Relation Age of Onset   Healthy Mother    Cancer Father    Hypertension Father    Healthy Sister    Healthy Brother    Healthy Daughter    Heart attack Maternal Grandmother    Heart attack Maternal Grandfather    Heart attack Paternal Grandmother    Heart attack Paternal Grandfather    Healthy Sister       Social History   Tobacco Use   Smoking status: Never   Smokeless tobacco: Never  Vaping Use   Vaping status: Never Used  Substance Use Topics   Alcohol use: Yes    Comment: occasional    Drug use: No     No Known Allergies  Current Outpatient Medications  Medication Sig Dispense Refill   atorvastatin  (LIPITOR) 80 MG  tablet Take 1 tablet (80 mg total) by mouth daily. 90 tablet 1   cyclobenzaprine (FLEXERIL) 5 MG tablet Take 5 mg by mouth at bedtime as needed for muscle spasms.     escitalopram  (LEXAPRO ) 20 MG tablet Take 20 mg by mouth daily.     hydrochlorothiazide  (HYDRODIURIL ) 12.5 MG tablet Take 12.5 mg by mouth daily.     losartan  (COZAAR ) 100 MG tablet Take 100 mg by mouth daily.     metFORMIN  (GLUCOPHAGE ) 500 MG tablet Take 1,000 mg by mouth 2 (two) times daily with a meal.     apixaban  (ELIQUIS ) 5 MG TABS tablet Take 2 tablets (10 mg total) by mouth 2 (two) times daily for 7 days, THEN 1 tablet (5 mg total) 2 (two) times daily. 120 tablet 1   MOUNJARO 5 MG/0.5ML Pen SMARTSIG:5 Milligram(s) SUB-Q Once a Week     No current facility-administered medications for this visit.      REVIEW OF SYSTEMS (Negative unless checked)  Constitutional: [] Weight loss  [] Fever  [] Chills Cardiac: [] Chest pain   [] Chest pressure   [] Palpitations   [] Shortness of breath when laying flat   [] Shortness of breath at rest   [] Shortness of breath with exertion. Vascular:  [] Pain in legs with walking   []   Pain in legs at rest   [] Pain in legs when laying flat   [] Claudication   [] Pain in feet when walking  [] Pain in feet at rest  [] Pain in feet when laying flat   [] History of DVT   [] Phlebitis   [] Swelling in legs   [] Varicose veins   [] Non-healing ulcers Pulmonary:   [] Uses home oxygen   [] Productive cough   [] Hemoptysis   [] Wheeze  [] COPD   [] Asthma Neurologic:  [] Dizziness  [] Blackouts   [] Seizures   [] History of stroke   [] History of TIA  [] Aphasia   [] Temporary blindness   [] Dysphagia   [] Weakness or numbness in arms   [] Weakness or numbness in legs Musculoskeletal:  [] Arthritis   [] Joint swelling   [] Joint pain   [] Low back pain Hematologic:  [] Easy bruising  [] Easy bleeding   [] Hypercoagulable state   [] Anemic  [] Hepatitis Gastrointestinal:  [] Blood in stool   [] Vomiting blood  [] Gastroesophageal reflux/heartburn    [x] Abdominal pain Genitourinary:  [] Chronic kidney disease   [] Difficult urination  [] Frequent urination  [] Burning with urination   [] Hematuria Skin:  [] Rashes   [] Ulcers   [] Wounds Psychological:  [] History of anxiety   []  History of major depression.    Physical Exam BP (!) 167/96   Pulse 74   Resp 16   Ht 4' 10 (1.473 m)   Wt 121 lb (54.9 kg)   LMP 08/22/2024 (Exact Date)   BMI 25.29 kg/m  Gen:  WD/WN, NAD Head: [place]/AT, No temporalis wasting.  Ear/Nose/Throat: Hearing grossly intact, nares w/o erythema or drainage, oropharynx w/o Erythema/Exudate Eyes: Conjunctiva clear, sclera non-icteric  Neck: trachea midline.  No JVD.  Pulmonary:  Good air movement, respirations not labored, no use of accessory muscles  Cardiac: RRR, no JVD Vascular:  Vessel Right Left  Radial Palpable Palpable                                   Gastrointestinal:. No masses, surgical incisions, or scars. Musculoskeletal: M/S 5/5 throughout.  Extremities without ischemic changes.  No deformity or atrophy. No edema. Neurologic: Sensation grossly intact in extremities.  Symmetrical.  Speech is fluent. Motor exam as listed above. Psychiatric: Judgment intact, Mood & affect appropriate for pt's clinical situation. Dermatologic: No rashes or ulcers noted.  No cellulitis or open wounds.    Radiology No results found.  Labs Recent Results (from the past 2160 hours)  Basic metabolic panel     Status: Abnormal   Collection Time: 06/26/24  1:43 PM  Result Value Ref Range   Sodium 131 (L) 135 - 145 mmol/L   Potassium 4.3 3.5 - 5.1 mmol/L   Chloride 98 98 - 111 mmol/L   CO2 23 22 - 32 mmol/L   Glucose, Bld 502 (HH) 70 - 99 mg/dL    Comment: CRITICAL RESULT CALLED TO, READ BACK BY AND VERIFIED WITH REINA GALJOUR @1415  06/26/24 MJU Glucose reference range applies only to samples taken after fasting for at least 8 hours.    BUN 14 6 - 20 mg/dL   Creatinine, Ser 9.37 0.44 - 1.00 mg/dL   Calcium  9.4  8.9 - 10.3 mg/dL   GFR, Estimated >39 >39 mL/min    Comment: (NOTE) Calculated using the CKD-EPI Creatinine Equation (2021)    Anion gap 10 5 - 15    Comment: Performed at [facility], [address], [place], [place] [address]  CBC  Status: Abnormal   Collection Time: 06/26/24  1:43 PM  Result Value Ref Range   WBC 10.8 (H) 4.0 - 10.5 K/uL   RBC 4.49 3.87 - 5.11 MIL/uL   Hemoglobin 12.5 12.0 - 15.0 g/dL   HCT 62.4 63.9 - 53.9 %   MCV 83.5 80.0 - 100.0 fL   MCH 27.8 26.0 - 34.0 pg   MCHC 33.3 30.0 - 36.0 g/dL   RDW 87.4 88.4 - 84.4 %   Platelets 428 (H) 150 - 400 K/uL   nRBC 0.0 0.0 - 0.2 %    Comment: Performed at [facility], [address] [place], [place] [address]  Hepatic function panel     Status: None   Collection Time: 06/26/24  1:43 PM  Result Value Ref Range   Total Protein 7.7 6.5 - 8.1 g/dL   Albumin 3.5 3.5 - 5.0 g/dL   AST 32 15 - 41 U/L   ALT 23 0 - 44 U/L   Alkaline Phosphatase 99 38 - 126 U/L   Total Bilirubin 0.5 0.0 - 1.2 mg/dL   Bilirubin, Direct <9.8 0.0 - 0.2 mg/dL   Indirect Bilirubin NOT CALCULATED 0.3 - 0.9 mg/dL    Comment: Performed at [facility], [address] [place], [place] [address]  Lipase, blood     Status: None   Collection Time: 06/26/24  1:43 PM  Result Value Ref Range   Lipase 41 11 - 51 U/L    Comment: Performed at [facility], [address] [place], [place] [address]  hCG, quantitative, pregnancy     Status: None   Collection Time: 06/26/24  1:43 PM  Result Value Ref Range   hCG, Beta Chain, Quant, S <1 <5 mIU/mL    Comment:          GEST. AGE      CONC.  (mIU/mL)   <=1 WEEK        5 - 50     2 WEEKS       50 - 500     3 WEEKS       100 - 10,000     4 WEEKS     1,000 - 30,000     5 WEEKS     3,500 - 115,000   6-8 WEEKS     12,000 - 270,000    12 WEEKS     15,000 - 220,000        FEMALE AND NON-PREGNANT FEMALE:     LESS THAN 5 mIU/mL Performed at [facility], [address] [place], [place] [address]   Hemoglobin A1c     Status: Abnormal   Collection Time: 06/26/24  1:43 PM  Result Value Ref Range   Hgb A1c MFr Bld 9.1 (H) 4.8 - 5.6 %    Comment: (NOTE) Diagnosis of Diabetes The following HbA1c ranges recommended by the American Diabetes Association (ADA) may be used as an aid in the diagnosis of diabetes mellitus.  Hemoglobin             Suggested A1C NGSP%              Diagnosis  <5.7                   Non Diabetic  5.7-6.4                Pre-Diabetic  >6.4  Diabetic  <7.0                   Glycemic control for                       adults with diabetes.     Mean Plasma Glucose 214.47 mg/dL    Comment: Performed at [facility], [address], [place], [place] [address]  TSH     Status: None   Collection Time: 06/26/24  1:43 PM  Result Value Ref Range   TSH 1.139 0.350 - 4.500 uIU/mL    Comment: Performed by a 3rd Generation assay with a functional sensitivity of <=0.01 uIU/mL. Performed at [facility], [address] [place], [place] [address]   Urinalysis, Routine w reflex microscopic -Urine, Clean Catch     Status: Abnormal   Collection Time: 06/26/24  2:32 PM  Result Value Ref Range   Color, Urine STRAW (A) YELLOW   APPearance CLEAR (A) CLEAR   Specific Gravity, Urine 1.029 1.005 - 1.030   pH 5.0 5.0 - 8.0   Glucose, UA >=500 (A) NEGATIVE mg/dL   Hgb urine dipstick NEGATIVE NEGATIVE   Bilirubin Urine NEGATIVE NEGATIVE   Ketones, ur NEGATIVE NEGATIVE mg/dL   Protein, ur NEGATIVE NEGATIVE mg/dL   Nitrite NEGATIVE NEGATIVE   Leukocytes,Ua NEGATIVE NEGATIVE   RBC / HPF 0-5 0 - 5 RBC/hpf   WBC, UA 0-5 0 - 5 WBC/hpf   Bacteria, UA NONE SEEN NONE SEEN   Squamous Epithelial / HPF 0-5 0 - 5 /HPF    Comment: Performed at [facility], [address] [place], [place] [address]  POC urine preg, ED     Status: None   Collection Time: 06/26/24  2:47 PM  Result Value Ref Range    Preg Test, Ur Negative Negative  APTT     Status: Abnormal   Collection Time: 06/26/24  5:30 PM  Result Value Ref Range   aPTT 23 (L) 24 - 36 seconds    Comment: Performed at [facility], [address] [place], [place] [address]  Protime-INR     Status: None   Collection Time: 06/26/24  5:30 PM  Result Value Ref Range   Prothrombin Time 12.2 11.4 - 15.2 seconds   INR 0.9 0.8 - 1.2    Comment: (NOTE) INR goal varies based on device and disease states. Performed at [facility], [address] [place], [place] [address]   CBG monitoring, ED     Status: Abnormal   Collection Time: 06/26/24  6:10 PM  Result Value Ref Range   Glucose-Capillary 224 (H) 70 - 99 mg/dL    Comment: Glucose reference range applies only to samples taken after fasting for at least 8 hours.  Glucose, capillary     Status: Abnormal   Collection Time: 06/26/24 11:32 PM  Result Value Ref Range   Glucose-Capillary 322 (H) 70 - 99 mg/dL    Comment: Glucose reference range applies only to samples taken after fasting for at least 8 hours.  Heparin  level (unfractionated)     Status: Abnormal   Collection Time: 06/26/24 11:46 PM  Result Value Ref Range   Heparin  Unfractionated 0.15 (L) 0.30 - 0.70 IU/mL    Comment: (NOTE) The clinical reportable range upper limit is being lowered to >1.10 to align with the FDA approved guidance for the current laboratory assay.  If heparin  results are below expected values,  and patient dosage has  been confirmed, suggest follow up testing of antithrombin III  levels. Performed at [facility], [address] [place], [place] [address]   Antithrombin III      Status: None   Collection Time: 06/26/24 11:46 PM  Result Value Ref Range   AntiThromb III Func 109 75 - 120 %    Comment: Performed at [facility], [address], [place], [place] [address]  Protein C activity     Status: Abnormal   Collection Time: 06/26/24 11:46 PM  Result Value  Ref Range   Protein C Activity 196 (H) 73 - 180 %    Comment: (NOTE) Elevated protein C activity is of no known clinical significance. Performed At: [facility] [address] [place], [place] [id number] Jennette Shorter MD Ey:[phone]   Protein C, total     Status: Abnormal   Collection Time: 06/26/24 11:46 PM  Result Value Ref Range   Protein C, Total 162 (H) 60 - 150 %    Comment: (NOTE) **Verified by repeat analysis** Performed At: [facility] [address] [place], [place] [id number] Jennette Shorter MD Ey:[id number]   Protein S activity     Status: Abnormal   Collection Time: 06/26/24 11:46 PM  Result Value Ref Range   Protein S Activity 59 (L) 63 - 140 %    Comment: (NOTE) A deficiency of protein S (PS), either congenital or acquired, increases the risk of thromboembolism. PS activity levels may be falsely low in individuals with APCR/Factor V Leiden. Consider performing free protein S antigen in those with APCR/Factor V Leiden before making a diagnosis of protein S deficiency. Acquired PS deficiency is more common than congenital deficiency. PS values decrease with normal pregnancy, and are also dependent on age, sex and hormone status. PS values tend to be lower in a younger age group and lower in women than in men. Levels may be decreased in pre-menopausal women on oral contraceptive agents. Acquired deficiency can occur as a result of vitamin K deficiency or antagonism, severe hepatic disorders, (hepatitis, cirrhosis, etc.), nephrotic syndrome, inflammatory bowel disease, certain chemotherapeutic agents, L-asparaginse therapy, sepsis, disseminated intravascular coagulation (DIC) and acute thrombosis. Levels may be decreased in  patients with polycythemia vera, sickle cell disease and essential thrombocythemia. Repeat evaluation on a new plasma sample to confirm or refute this result should be considered, after ruling out acquired causes, depending  on the clinical scenario. Performed At: [facility] [address] [place], [place] [id number] Jennette Shorter MD Ey:[phone]   Protein S, total     Status: None   Collection Time: 06/26/24 11:46 PM  Result Value Ref Range   Protein S Ag, Total 119 60 - 150 %    Comment: (NOTE) This test was developed and its performance characteristics determined by [facility]. It has not been cleared or approved by the Food and Drug Administration. Performed At: [facility] [address] [place], [place] [id number] Jennette Shorter MD Ey:[phone]   Lupus anticoagulant panel     Status: None   Collection Time: 06/26/24 11:46 PM  Result Value Ref Range   PTT Lupus Anticoagulant 30.6 0.0 - 43.5 sec   DRVVT 30.5 0.0 - 47.0 sec   Lupus Anticoag Interp Comment:     Comment: (NOTE) No lupus anticoagulant was detected. Performed At: [facility] [address] [place], [place] [id number] Jennette Shorter MD Ey:[id number]   Beta-2 -glycoprotein i abs, IgG/M/A     Status: None   Collection  Time: 06/26/24 11:46 PM  Result Value Ref Range   Beta-2  Glyco I IgG <9 0 - 20 GPI IgG units   Beta-2 -Glycoprotein I IgM <9 0 - 32 GPI IgM units    Comment: (NOTE) Performed At: [facility] [address] [place], [place] [id number] Jennette Shorter MD Ey:[id number]    Beta-2 -Glycoprotein I IgA <9 0 - 25 GPI IgA units  Homocysteine, serum     Status: None   Collection Time: 06/26/24 11:46 PM  Result Value Ref Range   Homocysteine 8.3 0.0 - 14.5 umol/L    Comment: (NOTE) Performed At: [facility] [address] [place], [place] [id number] Jennette Shorter MD Ey:[id number]   Factor 5 leiden     Status: None   Collection Time: 06/26/24 11:46 PM  Result Value Ref Range   Recommendations-F5LEID: Comment     Comment: (NOTE) Result: c.1601G>A (p.Arg534Gln) - Not Detected This result is not associated with an increased risk for venous thromboembolism. See Additional  Clinical Information and Comments. Additional Clinical Information:    Venous thromboembolism is a multifactorial disease influenced by genetic, environmental, and circumstantial risk factors. The c.1601G>A (p. Arg534Gln) variant in the F5 gene, commonly referred to as Factor V Leiden, is a genetic risk factor for venous thromboembolism. Heterozygous carriers of this variant have a 6- to 8- fold increased risk for venous thromboembolism. Individuals homozygous for this variant (ie, with a copy of the variant on each chromosome) have an approximately 80-fold increased risk for venous thromboembolism. Individuals who carry both a c.*97G>A variant in the F2 gene and Factor V Leiden have an approximately 20-fold increased risk for venous thromboembolism. Risks are likely to be even higher in more complex genotype combinations  involving the F2 c.*97G>A variant and Factor V Leiden (PMID: 66325232). Additional risk factors include but are not limited to: deficiency of protein C, protein S, or antithrombin III , age, female sex, personal or family history of deep vein thromboembolism, smoking, surgery, prolonged immobilization, malignant neoplasm, tamoxifen treatment, raloxifene treatment, oral contraceptive use, hormone replacement therapy, and pregnancy. Management of thrombotic risk and thrombotic events should follow established guidelines and fit the clinical circumstance. This result cannot predict the occurrence or recurrence of a thrombotic event. Comment:    Genetic counseling is recommended to discuss the potential clinical implications of positive results, as well as recommendations for testing family members.    Genetic Coordinators are available for health care providers to discuss results at [phone]). Test Details:    Variant Analyzed: c.1601G>A (p. Arg534Gln), referre d to as Factor V Leiden Methods/Limitations:    DNA analysis of the F5 gene (NM_000130.5) was  performed by PCR amplification followed by electrophoresis. The diagnostic sensitivity is >99%. Results must be combined with clinical information for the most accurate interpretation. Molecular-based testing is highly accurate, but as in any laboratory test, diagnostic errors may occur. False positive or false negative results may occur for reasons that include genetic variants, blood transfusions, bone marrow transplantation, somatic or tissue-specific mosaicism, mislabeled samples, or erroneous representation of family relationships.    This test was developed and its performance characteristics determined by [facility]. It has not been cleared or approved by the Food and Drug Administration. References:    Bhatt S, Taylor AK, Lozano R, Grody [facility], [facility]; ACMG Professional Practice and Guidelines Committee. Addendum: [employer] of Medical Genetics consensus statemen t on factor V Leiden mutation testing. Genet Med. 2021 Mar 5. doi: 89.8961/d58563-978- 01108-x. PMID: 66325232.    Hosey RUSH.  Factor V Leiden Thrombophilia. 1999 May 14 (Updated 2018 Jan 4). In: Juliene POSNER, Ardinger HH, Pagon RA, et al., editors. GeneReviews(R) (Internet). [address] ([place]): [place] , [place]; 8006-7978. Available from: [email or web address]    Laurita GORMAN Waddell BRIDGETT, Huang X, Luo B, Spector EB, Ileana SHAUNNA Gal CS; ACMG Laboratory Quality Assurance Committee. Venous thromboembolism laboratory testing (factor V Leiden and factor II c. *97G>A), 2018 update: a technical standard of the [employer] of [employer] and Genomics (ACMG). Genet Med. 2018 Izr;79(87): 8510-8501. doi: 10.1038/[id number]-z. Epub 2018 Oct 5. PMID: 69702301.    Reviewed By: Comment     Comment: (NOTE) Technical Component performed at [facility] Professional Component performed by: Larose Agent, PhD, [facility] [id number], [facility], [address] [place] [address] Performed At: [facility] [address] [place], [place] [id number] Loran Gales MDPhD Ey:[id number]   Prothrombin gene mutation     Status: None   Collection Time: 06/26/24 11:46 PM  Result Value Ref Range   Recommendations-PTGENE: Comment     Comment: (NOTE) Result: c.*97G>A - Not Detected This result is not associated with an increased risk for venous thromboembolism. See Additional Clinical Information and Comments. Additional Clinical Information: Venous thromboembolism is a multifactorial disease influenced by genetic, environmental, and circumstantial risk factors. The c.*97G>A variant in the F2 gene is a genetic risk factor for venous thromboembolism. Heterozygous carriers have a 2- to 4-fold increased risk for venous thromboembolism. Homozygotes for the c.*97G>A variant are rare. The annual risk of VTE in homozygotes has been reported to be 1.1%/year. Individuals who carry both a c.*97G>A variant in the F2 gene and a c.1601G>A (p. Arg534Gln) variant in the F5 gene (commonly referred to as Factor V Leiden) have an approximately 20- fold increased risk for venous thromboembolism. Risks are likely to be even higher in more complex genotype combinations involving the F2 c.*97G>A variant and Factor V Leiden (PMID:  66325232). Additional risk factors include but are not limited to: deficiency of protein C, protein S, or antithrombin III , age, female sex, personal or family history of deep vein thromboembolism, smoking, surgery, prolonged immobilization, malignant neoplasm, tamoxifen treatment, raloxifene treatment, oral contraceptive use, hormone replacement therapy, and pregnancy. Management of thrombotic risk and thrombotic events should follow established guidelines and fit the clinical circumstance. This result cannot predict the occurrence or recurrence of a thrombotic event. Comments: Genetic counseling is recommended to discuss the potential clinical implications of positive results, as  well as recommendations for testing family members. Genetic Coordinators are available for health care providers to discuss results at [phone]). Test Details: Variant analyzed: c.*97G>A, previously referred to as G20210A Methods/Limitations: DNA analysis of the F2 gene (NM_000 506.5) was performed by PCR amplification followed by restriction enzyme analysis. The diagnostic sensitivity is >99%. Results must be combined with clinical information for the most accurate interpretation. Molecular-based testing is highly accurate, but as in any laboratory test, diagnostic errors may occur. False positive or false negative results may occur for reasons that include genetic variants, blood transfusions, bone marrow transplantation, somatic or tissue-specific mosaicism, mislabeled samples, or erroneous representation of family relationships. This test was developed and its performance characteristics determined by [facility]. It has not been cleared or approved by the Food and Drug Administration. References: Bhatt S, Taylor AK, Lozano R, Grody [facility], [facility]; ACMG Professional Practice and Guidelines Committee. Addendum: [employer] of Medical Genetics consensus statement on factor V Leiden mutation testing. Genet Med. 2021 Mar 5. doi: 89.8961/d585 [id number]-x. PMID: 66325232. Hosey RUSH.  Prothrombin Thrombophilia. 2006 Jul 25 [Updated 2021 Feb 4]. In: Juliene POSNER, Ardinger HH, Pagon RA, et al., editors. GeneReviews(R) [Internet]. [facility]): [employer] , [place]; 8006-7978. Available from: [email or web address] Laurita GORMAN Waddell BRIDGETT, Huang X, Luo B, Spector EB, Ileana SHAUNNA Gal CS; ACMG Laboratory Quality Assurance Committee. Venous thromboembolism laboratory testing (factor V Leiden and factor II c.*97G>A), 2018 update: a technical standard of the [employer] of [employer] and Gen[employer] (ACMG). Genet Med. 2018  Dec;20(12):1489-1498. doi: 10.1038/[id number]-z. Epub 2018 Oct 5. PMID: 69702301.    Reviewed by: Comment     Comment: (NOTE) Technical Component performed at [facility] Professional Component performed by: Andree FABIENE Lex, PhD, [facility] [id number], [facility], [address] [place] [address] Performed At: [facility] [address] [place], [place] [id number] Loran Gales MDPhD Ey:[id number]   Cardiolipin antibodies, IgG, IgM, IgA     Status: None   Collection Time: 06/26/24 11:46 PM  Result Value Ref Range   Anticardiolipin IgG 11 0 - 14 GPL U/mL    Comment: (NOTE)                          Negative:              <15                          Indeterminate:     15 - 20                          Low-Med Positive: >20 - 80                          High Positive:         >80    Anticardiolipin IgM <9 0 - 12 MPL U/mL    Comment: (NOTE)                          Negative:              <13                          Indeterminate:     13 - 20                          Low-Med Positive: >20 - 80                          High Positive:         >80    Anticardiolipin IgA <9 0 - 11 APL U/mL    Comment: (NOTE)                          Negative:              <12                          Indeterminate:     12 - 20                          Low-Med Positive: >20 - 80  High Positive:         >80 Performed At: [facility] [address] [place], [place] [id number] Jennette Shorter MD Ey:[id number]   ANCA Profile     Status: None   Collection Time: 06/26/24 11:46 PM  Result Value Ref Range   Anti-MPO Antibodies <0.2 0.0 - 0.9 units   Anti-PR3 Antibodies <0.2 0.0 - 0.9 units   C-ANCA <1:20 Neg:<1:20 titer   P-ANCA <1:20 Neg:<1:20 titer    Comment: (NOTE) The presence of positive fluorescence exhibiting P-ANCA or C-ANCA patterns alone is not specific for the diagnosis of Wegener's Granulomatosis (WG) or microscopic polyangiitis. Decisions about treatment  should not be based solely on ANCA IFA results.  The International ANCA Group Consensus recommends follow up testing of positive sera with both PR-3 and MPO-ANCA enzyme immunoassays. As many as 5% serum samples are positive only by EIA. Ref. AM J Clin Pathol 1999;111:507-513.    Atypical P-ANCA titer <1:20 Neg:<1:20 titer    Comment: (NOTE) The atypical pANCA pattern has been observed in a significant percentage of patients with ulcerative colitis, primary sclerosing cholangitis and autoimmune hepatitis. Performed At: [facility] [address] [place], [place] [id number] Jennette Shorter MD Ey:[id number]   CBC     Status: Abnormal   Collection Time: 06/27/24  3:28 AM  Result Value Ref Range   WBC 13.1 (H) 4.0 - 10.5 K/uL   RBC 4.14 3.87 - 5.11 MIL/uL   Hemoglobin 11.5 (L) 12.0 - 15.0 g/dL   HCT 64.8 (L) 63.9 - 53.9 %   MCV 84.8 80.0 - 100.0 fL   MCH 27.8 26.0 - 34.0 pg   MCHC 32.8 30.0 - 36.0 g/dL   RDW 87.2 88.4 - 84.4 %   Platelets 447 (H) 150 - 400 K/uL   nRBC 0.0 0.0 - 0.2 %    Comment: Performed at [facility], [address], [place], [place] [address]  Basic metabolic panel     Status: Abnormal   Collection Time: 06/27/24  3:28 AM  Result Value Ref Range   Sodium 135 135 - 145 mmol/L   Potassium 3.9 3.5 - 5.1 mmol/L   Chloride 100 98 - 111 mmol/L   CO2 27 22 - 32 mmol/L   Glucose, Bld 215 (H) 70 - 99 mg/dL    Comment: Glucose reference range applies only to samples taken after fasting for at least 8 hours.   BUN 12 6 - 20 mg/dL   Creatinine, Ser 9.38 0.44 - 1.00 mg/dL   Calcium  8.7 (L) 8.9 - 10.3 mg/dL   GFR, Estimated >39 >39 mL/min    Comment: (NOTE) Calculated using the CKD-EPI Creatinine Equation (2021)    Anion gap 8 5 - 15    Comment: Performed at [facility], [address] [place], [place] [address]  Lipid panel     Status: Abnormal   Collection Time: 06/27/24  3:28 AM  Result Value Ref Range   Cholesterol 228 (H) 0 - 200  mg/dL   Triglycerides 239 (H) <150 mg/dL   HDL 35 (L) >59 mg/dL   Total CHOL/HDL Ratio 6.5 RATIO   VLDL UNABLE TO CALCULATE IF TRIGLYCERIDE OVER 400 mg/dL 0 - 40 mg/dL   LDL Cholesterol UNABLE TO CALCULATE IF TRIGLYCERIDE OVER 400 mg/dL 0 - 99 mg/dL    Comment:        Total Cholesterol/HDL:CHD Risk Coronary Heart Disease Risk Table  Men   Women  1/2 Average Risk   3.4   3.3  Average Risk       5.0   4.4  2 X Average Risk   9.6   7.1  3 X Average Risk  23.4   11.0        Use the calculated Patient Ratio above and the CHD Risk Table to determine the patient's CHD Risk.        ATP III CLASSIFICATION (LDL):  <100     mg/dL   Optimal  899-870  mg/dL   Near or Above                    Optimal  130-159  mg/dL   Borderline  839-810  mg/dL   High  >809     mg/dL   Very High Performed at [facility], [address] [place], [place] [address]   LDL cholesterol, direct     Status: Abnormal   Collection Time: 06/27/24  3:28 AM  Result Value Ref Range   Direct LDL 144 (H) 0 - 99 mg/dL    Comment: Performed at [facility], [address], [place], [place] [address]  Heparin  level (unfractionated)     Status: Abnormal   Collection Time: 06/27/24  6:42 AM  Result Value Ref Range   Heparin  Unfractionated 0.20 (L) 0.30 - 0.70 IU/mL    Comment: (NOTE) The clinical reportable range upper limit is being lowered to >1.10 to align with the FDA approved guidance for the current laboratory assay.  If heparin  results are below expected values, and patient dosage has  been confirmed, suggest follow up testing of antithrombin III  levels. Performed at [facility], [address] [place], [place] [address]   Glucose, capillary     Status: Abnormal   Collection Time: 06/27/24  7:16 AM  Result Value Ref Range   Glucose-Capillary 260 (H) 70 - 99 mg/dL    Comment: Glucose reference range applies only to samples taken after fasting for at least 8  hours.  ECHOCARDIOGRAM COMPLETE     Status: None   Collection Time: 06/27/24  8:40 AM  Result Value Ref Range   Weight 1,950.63 oz   Height 58 in   BP 155/93 mmHg   S' Lateral 2.52 cm   Area-P 1/2 3.99 cm2   Est EF 65 - 70%     Assessment/Plan:  Aortic thrombus (HCC) We repeated a duplex which showed resolution of her aortic thrombus in the juxtarenal/suprarenal aorta.  The echogenicity of her kidneys was abnormal particular on the right consistent with her renal infarcts but there was no significant renal artery stenosis.   I feel fairly strongly that she needs to be on more than just aspirin therapy with aortic thrombus with recent renal infarcts.  She also has a previous history of DVT and a brother who has history of DVT so she is very likely to have a hypercoagulable state.  I have given her 1 month of Eliquis  samples today.  Have also given her the co-pay card and the free trial card.  I think she should be on full dose Eliquis  for about a year.  Then, given her likely hypercoagulable state I think a prophylactic dose would be helpful if it is not cost prohibitive.  Will see her back in a few months with a repeat duplex.   Type 2 diabetes mellitus with hyperglycemia, without long-term current use of insulin  (  HCC) blood glucose control important in reducing the progression of atherosclerotic disease. Also, involved in wound healing. On appropriate medications.   Hypertension associated with type 2 diabetes mellitus (HCC) blood pressure control important in reducing the progression of atherosclerotic disease. On appropriate oral medications.   Renal infarct We repeated a duplex which showed resolution of her aortic thrombus in the juxtarenal/suprarenal aorta.  The echogenicity of her kidneys was abnormal particular on the right consistent with her renal infarcts but there was no significant renal artery stenosis.  Her infarcts seem to be improving.  Her renal function has remained  okay.  Her aortic thrombus is now resolved but I have recommended reinstitution of anticoagulation.      Selinda Gu 08/26/2024, 3:54 PM   This note was created with Dragon medical transcription system.  Any errors from dictation are unintentional.

## 2024-08-26 NOTE — Assessment & Plan Note (Addendum)
 We repeated a duplex which showed resolution of her aortic thrombus in the juxtarenal/suprarenal aorta.  The echogenicity of her kidneys was abnormal particular on the right consistent with her renal infarcts but there was no significant renal artery stenosis.   I feel fairly strongly that she needs to be on more than just aspirin therapy with aortic thrombus with recent renal infarcts.  She also has a previous history of DVT and a brother who has history of DVT so she is very likely to have a hypercoagulable state.  I have given her 1 month of Eliquis  samples today.  Have also given her the co-pay card and the free trial card.  I think she should be on full dose Eliquis  for about a year.  Then, given her likely hypercoagulable state I think a prophylactic dose would be helpful if it is not cost prohibitive.  Will see her back in a few months with a repeat duplex.

## 2024-08-26 NOTE — Assessment & Plan Note (Signed)
 blood pressure control important in reducing the progression of atherosclerotic disease. On appropriate oral medications.

## 2024-08-26 NOTE — Assessment & Plan Note (Signed)
 blood glucose control important in reducing the progression of atherosclerotic disease. Also, involved in wound healing. On appropriate medications.

## 2024-12-02 ENCOUNTER — Ambulatory Visit (INDEPENDENT_AMBULATORY_CARE_PROVIDER_SITE_OTHER): Admitting: Vascular Surgery

## 2024-12-02 ENCOUNTER — Encounter (INDEPENDENT_AMBULATORY_CARE_PROVIDER_SITE_OTHER)
